# Patient Record
Sex: Male | Born: 1960
Health system: Southern US, Community
[De-identification: ages and names within clinical notes are randomized; demographics above are authoritative.]

## PROBLEM LIST (undated history)

## (undated) DIAGNOSIS — K589 Irritable bowel syndrome without diarrhea: Secondary | ICD-10-CM

---

## 2016-05-01 ENCOUNTER — Ambulatory Visit (INDEPENDENT_AMBULATORY_CARE_PROVIDER_SITE_OTHER): Payer: BLUE CROSS/BLUE SHIELD | Admitting: Family Medicine

## 2016-05-01 ENCOUNTER — Encounter: Payer: Self-pay | Admitting: Family Medicine

## 2016-05-01 ENCOUNTER — Encounter (INDEPENDENT_AMBULATORY_CARE_PROVIDER_SITE_OTHER): Payer: Self-pay

## 2016-05-01 VITALS — BP 147/95 | HR 94 | Temp 98.5°F | Ht 70.5 in | Wt 212.4 lb

## 2016-05-01 DIAGNOSIS — B353 Tinea pedis: Secondary | ICD-10-CM | POA: Diagnosis not present

## 2016-05-01 DIAGNOSIS — B379 Candidiasis, unspecified: Secondary | ICD-10-CM | POA: Diagnosis not present

## 2016-05-01 DIAGNOSIS — R42 Dizziness and giddiness: Secondary | ICD-10-CM

## 2016-05-01 MED ORDER — TERBINAFINE HCL 1 % EX CREA: 1.0000 "application " | TOPICAL_CREAM | Freq: Two times a day (BID) | CUTANEOUS | 0 refills | Status: DC

## 2016-05-01 MED ORDER — NYSTATIN 100000 UNIT/GM EX CREA: 1.0000 "application " | TOPICAL_CREAM | Freq: Two times a day (BID) | CUTANEOUS | 2 refills | Status: DC

## 2016-05-01 NOTE — Progress Notes (Signed)
BP (!) 147/95 (BP Location: Right Arm, Patient Position: Sitting, Cuff Size: Normal)   Pulse 94   Temp 98.5 F (36.9 C)   Ht 5' 10.5" (1.791 m)   Wt 212 lb 6.4 oz (96.3 kg)   SpO2 95%   BMI 30.05 kg/m    Subjective:    Patient ID: Carlos Holmes, male    DOB: 1961/01/21, 55 y.o.   MRN: [id number]  HPI: Carlos Poplmer Heinecke is a 55 y.o. male  Chief Complaint  Patient presents with  . Establish Care  . Other    Food problem/eating problem. x's 40 years. Has collapsed before. Has blacked out before. Eats regularly. But will feel terrible. Headaches, fatigue. Did blood tests 20 years ago for diabetes; blood work came back normal.    Patient presents today for Est Care visit. Has not been seen for 10-15 years now. Unaware of any medical problems and is not on any presciption medications. States for over 20 years, he has been struggling with headaches, dizziness, syncope that appears to be closely tied to diet. Usually resolves with eating, but will feel fatigued for 1-3 days afterwards. Has not blacked out in a long time (15 years) because he has been very careful about what he eats. Becomes worse when he's out in the heat.   Has been working out more but not able to lose weight. Has gained about 50 lb in the past 3 years despite being very careful about diet and exercise. Has been doing 5ks with his wife with no issues.   Has rash in b/l groin area that seems to only appear with consumption of sugar. Alternates between hydrocortisone and jock itch cream over the counter with minimal relief. Also has an intermittent rash on right bottom of foot that peels quite a bit and can be itchy at times. Has not tried any creams.                   Maternal fhx of Diabetes - likely type 2, heart disease (MIs in early 4940s) Paternal fhx of HTN   Relevant past medical, surgical, family and social history reviewed and updated as indicated. Interim medical history since our last visit reviewed. Allergies and  medications reviewed and updated.  Review of Systems  Constitutional: Positive for fatigue.  Respiratory: Negative.   Cardiovascular: Negative.   Gastrointestinal: Negative.   Musculoskeletal: Negative.   Neurological: Positive for dizziness, syncope and headaches.  Psychiatric/Behavioral: Negative.     Per HPI unless specifically indicated above     Objective:    BP (!) 147/95 (BP Location: Right Arm, Patient Position: Sitting, Cuff Size: Normal)   Pulse 94   Temp 98.5 F (36.9 C)   Ht 5' 10.5" (1.791 m)   Wt 212 lb 6.4 oz (96.3 kg)   SpO2 95%   BMI 30.05 kg/m   Wt Readings from Last 3 Encounters:  05/01/16 212 lb 6.4 oz (96.3 kg)    Physical Exam  Constitutional: He is oriented to person, place, and time. He appears well-developed and well-nourished. No distress.  HENT:  Head: Atraumatic.  Eyes: Conjunctivae are normal. No scleral icterus.  Neck: Normal range of motion. Neck supple.  Cardiovascular: Normal rate, regular rhythm and normal heart sounds.   Pulmonary/Chest: Effort normal and breath sounds normal. No respiratory distress.  Abdominal: Soft. Bowel sounds are normal.  Musculoskeletal: Normal range of motion.  Lymphadenopathy:    He has no cervical adenopathy.  Neurological: He is alert and  oriented to person, place, and time.  Skin: Skin is warm and dry.  Psychiatric: He has a normal mood and affect. His behavior is normal.  Nursing note and vitals reviewed.   No results found for this or any previous visit.    Assessment & Plan:   Problem List Items Addressed This Visit    None    Visit Diagnoses    Dizziness    -  Primary   Basic labs and EKG today to begin. Will proceed according to results.    Relevant Orders   Comprehensive metabolic panel   CBC with Differential/Platelet   HgB A1c   TSH   EKG 12-Lead (Completed)   Candida infection       B/l groin, nystatin cream sent. Recommended he keep area clean and dry. Can use powders. Only cotton  underwear.    Relevant Medications   nystatin cream (MYCOSTATIN)   terbinafine (LAMISIL AT) 1 % cream   Tinea pedis of right foot       Lamisil cream sent. Patient would like to try OTC creams first for fungal infections but will try this if no relief. Change shoes often, use powders to keep dry   Relevant Medications   nystatin cream (MYCOSTATIN)   terbinafine (LAMISIL AT) 1 % cream    EKG normal today, exam benign. Likely an endocrine Suggested that patient schedule full physical as he is overdue and that could serve as his follow up/next steps, but patient wishes to hold off on anything comprehensive until this is figured out as the issue is becoming more severe and frequent and worrying him a good deal. Discussed that he should go to the ER for worsening or changing symptoms. Will schedule follow up when lab results are given.   Follow up plan: Return if symptoms worsen or fail to improve.

## 2016-05-01 NOTE — Patient Instructions (Signed)
Follow up as needed

## 2016-05-02 ENCOUNTER — Ambulatory Visit: Payer: Self-pay | Admitting: Family Medicine

## 2016-05-02 LAB — HEMOGLOBIN A1C
ESTIMATED AVERAGE GLUCOSE: 117 mg/dL
Hgb A1c MFr Bld: 5.7 % — ABNORMAL HIGH (ref 4.8–5.6)

## 2016-05-02 LAB — CBC WITH DIFFERENTIAL/PLATELET
Basophils Absolute: 0.1 10*3/uL (ref 0.0–0.2)
Basos: 1 %
EOS (ABSOLUTE): 0.2 10*3/uL (ref 0.0–0.4)
EOS: 2 %
HEMATOCRIT: 41.3 % (ref 37.5–51.0)
HEMOGLOBIN: 13.4 g/dL (ref 12.6–17.7)
Immature Grans (Abs): 0 10*3/uL (ref 0.0–0.1)
Immature Granulocytes: 0 %
LYMPHS ABS: 2 10*3/uL (ref 0.7–3.1)
Lymphs: 23 %
MCH: 29.2 pg (ref 26.6–33.0)
MCHC: 32.4 g/dL (ref 31.5–35.7)
MCV: 90 fL (ref 79–97)
MONOCYTES: 8 %
Monocytes Absolute: 0.7 10*3/uL (ref 0.1–0.9)
NEUTROS ABS: 5.5 10*3/uL (ref 1.4–7.0)
Neutrophils: 66 %
Platelets: 239 10*3/uL (ref 150–379)
RBC: 4.59 x10E6/uL (ref 4.14–5.80)
RDW: 14.1 % (ref 12.3–15.4)
WBC: 8.3 10*3/uL (ref 3.4–10.8)

## 2016-05-02 LAB — COMPREHENSIVE METABOLIC PANEL
ALBUMIN: 4.4 g/dL (ref 3.5–5.5)
ALT: 26 IU/L (ref 0–44)
AST: 18 IU/L (ref 0–40)
Albumin/Globulin Ratio: 1.6 (ref 1.2–2.2)
Alkaline Phosphatase: 75 IU/L (ref 39–117)
BILIRUBIN TOTAL: 0.3 mg/dL (ref 0.0–1.2)
BUN / CREAT RATIO: 12 (ref 9–20)
BUN: 15 mg/dL (ref 6–24)
CO2: 26 mmol/L (ref 18–29)
CREATININE: 1.23 mg/dL (ref 0.76–1.27)
Calcium: 9.5 mg/dL (ref 8.7–10.2)
Chloride: 101 mmol/L (ref 96–106)
GFR calc non Af Amer: 65 mL/min/{1.73_m2} (ref >59)
GFR, EST AFRICAN AMERICAN: 75 mL/min/{1.73_m2} (ref >59)
GLUCOSE: 114 mg/dL — AB (ref 65–99)
Globulin, Total: 2.7 g/dL (ref 1.5–4.5)
Potassium: 4.3 mmol/L (ref 3.5–5.2)
Sodium: 143 mmol/L (ref 134–144)
TOTAL PROTEIN: 7.1 g/dL (ref 6.0–8.5)

## 2016-05-02 LAB — TSH: TSH: 1.65 u[IU]/mL (ref 0.450–4.500)

## 2016-05-06 ENCOUNTER — Telehealth: Payer: Self-pay | Admitting: Family Medicine

## 2016-05-06 DIAGNOSIS — R55 Syncope and collapse: Secondary | ICD-10-CM

## 2016-05-06 NOTE — Telephone Encounter (Signed)
Please call patient and let him know that his labs and EKG came back normal, other than his A1C just barely being in the pre-diabetes category at 5.7. Nothing here explains his passing out spells related to eating, so I would like to refer him to endocrinology at this point for further investigation as I cannot explain a cause. Let me know if he is agreeable and I will place the referral.

## 2016-05-06 NOTE — Telephone Encounter (Signed)
Left message to call.

## 2016-05-06 NOTE — Telephone Encounter (Signed)
Referral to endocrinology placed

## 2016-05-06 NOTE — Telephone Encounter (Signed)
Patient notified. He is willing to go to endocrinology. Please enter referral.  He states any day is fine, but prefers early am or late pm.

## 2016-05-21 DIAGNOSIS — R42 Dizziness and giddiness: Secondary | ICD-10-CM | POA: Diagnosis not present

## 2016-05-22 DIAGNOSIS — R42 Dizziness and giddiness: Secondary | ICD-10-CM | POA: Diagnosis not present

## 2016-06-26 DIAGNOSIS — R7303 Prediabetes: Secondary | ICD-10-CM | POA: Diagnosis not present

## 2016-09-23 DIAGNOSIS — R7303 Prediabetes: Secondary | ICD-10-CM | POA: Diagnosis not present

## 2016-09-30 DIAGNOSIS — R7303 Prediabetes: Secondary | ICD-10-CM | POA: Diagnosis not present

## 2016-09-30 DIAGNOSIS — E663 Overweight: Secondary | ICD-10-CM | POA: Diagnosis not present

## 2016-12-09 ENCOUNTER — Ambulatory Visit (INDEPENDENT_AMBULATORY_CARE_PROVIDER_SITE_OTHER): Payer: BLUE CROSS/BLUE SHIELD | Admitting: Family Medicine

## 2016-12-09 ENCOUNTER — Encounter: Payer: Self-pay | Admitting: Family Medicine

## 2016-12-09 ENCOUNTER — Ambulatory Visit
Admission: RE | Admit: 2016-12-09 | Discharge: 2016-12-09 | Disposition: A | Discharge status: Home / Self Care | Payer: BLUE CROSS/BLUE SHIELD | Source: Ambulatory Visit | Attending: Family Medicine | Admitting: Family Medicine

## 2016-12-09 VITALS — BP 133/89 | HR 96 | Temp 97.9°F | Resp 17 | Ht 70.5 in | Wt 263.0 lb

## 2016-12-09 DIAGNOSIS — H9313 Tinnitus, bilateral: Secondary | ICD-10-CM | POA: Diagnosis not present

## 2016-12-09 DIAGNOSIS — R4020 Unspecified coma: Secondary | ICD-10-CM

## 2016-12-09 DIAGNOSIS — H73893 Other specified disorders of tympanic membrane, bilateral: Secondary | ICD-10-CM | POA: Insufficient documentation

## 2016-12-09 DIAGNOSIS — R55 Syncope and collapse: Secondary | ICD-10-CM | POA: Diagnosis not present

## 2016-12-09 DIAGNOSIS — H9319 Tinnitus, unspecified ear: Secondary | ICD-10-CM | POA: Insufficient documentation

## 2016-12-09 DIAGNOSIS — W19XXXA Unspecified fall, initial encounter: Secondary | ICD-10-CM | POA: Diagnosis not present

## 2016-12-09 DIAGNOSIS — R51 Headache: Secondary | ICD-10-CM | POA: Diagnosis not present

## 2016-12-09 NOTE — Progress Notes (Signed)
BP 133/89 (BP Location: Left Arm, Patient Position: Sitting, Cuff Size: Large)   Pulse 96   Temp 97.9 F (36.6 C) (Oral)   Resp 17   Ht 5' 10.5" (1.791 m)   Wt 263 lb (119.3 kg)   SpO2 96%   BMI 37.20 kg/m    Subjective:    Patient ID: Carlos Holmes, male    DOB: 08/02/1961, 56 y.o.   MRN: [id number]  HPI: Carlos Holmes is a 56 y.o. male  Chief Complaint  Patient presents with  . Tinnitus    Onset many years/ constant now   About 6 weeks ago patient got up too quickly, blacked out and fell. He hit his head and gave himself a really big goose egg. He did not go to get checked out. He has not seen a doctor since then. He has had a severe headache. He is not sure if he passed out or not as he was alone when it happened. Unclear if he was out at the time. He does not think that the tinnitus is related to his fall, but it did intensify around the same time.   TINNITUS Duration: 10+ years- significantly worse over the last month Description of tinnitus: Changes in amplitude, "noise" low amplitude "ehhhhh" can get to be almost screaming Pulsatile: no Tinnitus duration: continuous Episode frequency: continous- changes pitch and amplitude Severity: severe Aggravating factors: phentermine Alleviating factors: unknown Head injury: yes Chronic exposure to loud noises: yes Exposure to ototoxic medications: no Vertigo:no Hearing loss: yes Aural fullness: yes Headache:yes  TMJ syndrome symptoms: no Unsteady gait: no Postural instability: no Diplopia, dysarthria, dysphagia or weakness: no, has been having some dysarthria for several years, but has been getting worse Anxietydepression: no  Relevant past medical, surgical, family and social history reviewed and updated as indicated. Interim medical history since our last visit reviewed. Allergies and medications reviewed and updated.  Review of Systems  Constitutional: Negative.   HENT: Positive for ear discharge, hearing loss and  tinnitus. Negative for congestion, dental problem, drooling, ear pain, facial swelling, mouth sores, nosebleeds, postnasal drip, rhinorrhea, sinus pain, sinus pressure, sneezing, sore throat, trouble swallowing and voice change.   Respiratory: Negative.   Cardiovascular: Negative.   Neurological: Positive for dizziness, syncope, light-headedness and headaches. Negative for tremors, seizures, facial asymmetry, speech difficulty, weakness and numbness.  Psychiatric/Behavioral: Negative.     Per HPI unless specifically indicated above     Objective:    BP 133/89 (BP Location: Left Arm, Patient Position: Sitting, Cuff Size: Large)   Pulse 96   Temp 97.9 F (36.6 C) (Oral)   Resp 17   Ht 5' 10.5" (1.791 m)   Wt 263 lb (119.3 kg)   SpO2 96%   BMI 37.20 kg/m   Wt Readings from Last 3 Encounters:  12/09/16 263 lb (119.3 kg)  05/01/16 212 lb 6.4 oz (96.3 kg)     Hearing Screening             Right ear:   40 40 25      Left ear:   25 40 25        Physical Exam  Constitutional: He is oriented to person, place, and time. He appears well-developed and well-nourished. No distress.  HENT:  Head: Normocephalic and atraumatic.  Right Ear: Hearing and external ear normal.  Left Ear: Hearing and external ear normal.  Nose: Nose normal.  Mouth/Throat: Oropharynx is clear and moist. No oropharyngeal exudate.  Eyes: Conjunctivae, EOM  and lids are normal. Pupils are equal, round, and reactive to light. Right eye exhibits no discharge. Left eye exhibits no discharge. No scleral icterus.  Neck: Normal range of motion. Neck supple. No JVD present. No tracheal deviation present. No thyromegaly present.  Cardiovascular: Normal rate, regular rhythm, normal heart sounds and intact distal pulses.  Exam reveals no gallop and no friction rub.   No murmur heard. Pulmonary/Chest: Effort normal and breath sounds normal. No stridor. No respiratory distress.  He has no wheezes. He has no rales. He exhibits no tenderness.  Musculoskeletal: Normal range of motion.  Lymphadenopathy:    He has no cervical adenopathy.  Neurological: He is alert and oriented to person, place, and time. He has normal reflexes. He displays normal reflexes. No cranial nerve deficit. He exhibits normal muscle tone. Coordination normal.  Negative rhomberg Negative pronator driff  Skin: Skin is warm, dry and intact. No rash noted. He is not diaphoretic. No erythema. No pallor.  Psychiatric: He has a normal mood and affect. His speech is normal and behavior is normal. Judgment and thought content normal. Cognition and memory are normal.  Nursing note and vitals reviewed.   Results for orders placed or performed in visit on 05/01/16  Comprehensive metabolic panel  Result Value Ref Range   Glucose 114 (H) 65 - 99 mg/dL   BUN 15 6 - 24 mg/dL   Creatinine, Ser 1.61 0.76 - 1.27 mg/dL   GFR calc non Af Amer 65 >59 mL/min/1.73   GFR calc Af Amer 75 >59 mL/min/1.73   BUN/Creatinine Ratio 12 9 - 20   Sodium 143 134 - 144 mmol/L   Potassium 4.3 3.5 - 5.2 mmol/L   Chloride 101 96 - 106 mmol/L   CO2 26 18 - 29 mmol/L   Calcium 9.5 8.7 - 10.2 mg/dL   Total Protein 7.1 6.0 - 8.5 g/dL   Albumin 4.4 3.5 - 5.5 g/dL   Globulin, Total 2.7 1.5 - 4.5 g/dL   Albumin/Globulin Ratio 1.6 1.2 - 2.2   Bilirubin Total 0.3 0.0 - 1.2 mg/dL   Alkaline Phosphatase 75 39 - 117 IU/L   AST 18 0 - 40 IU/L   ALT 26 0 - 44 IU/L  CBC with Differential/Platelet  Result Value Ref Range   WBC 8.3 3.4 - 10.8 x10E3/uL   RBC 4.59 4.14 - 5.80 x10E6/uL   Hemoglobin 13.4 12.6 - 17.7 g/dL   Hematocrit 09.6 04.5 - 51.0 %   MCV 90 79 - 97 fL   MCH 29.2 26.6 - 33.0 pg   MCHC 32.4 31.5 - 35.7 g/dL   RDW 40.9 81.1 - 91.4 %   Platelets 239 150 - 379 x10E3/uL   Neutrophils 66 %   Lymphs 23 %   Monocytes 8 %   Eos 2 %   Basos 1 %   Neutrophils Absolute 5.5 1.4 - 7.0 x10E3/uL   Lymphocytes Absolute 2.0 0.7  - 3.1 x10E3/uL   Monocytes Absolute 0.7 0.1 - 0.9 x10E3/uL   EOS (ABSOLUTE) 0.2 0.0 - 0.4 x10E3/uL   Basophils Absolute 0.1 0.0 - 0.2 x10E3/uL   Immature Granulocytes 0 %   Immature Grans (Abs) 0.0 0.0 - 0.1 x10E3/uL  HgB A1c  Result Value Ref Range   Hgb A1c MFr Bld 5.7 (H) 4.8 - 5.6 %   Est. average glucose Bld gHb Est-mCnc 117 mg/dL  TSH  Result Value Ref Range   TSH 1.650 0.450 - 4.500 uIU/mL  Assessment & Plan:   Problem List Items Addressed This Visit    None    Visit Diagnoses    Tinnitus of both ears    -  Primary   Will obtain CT due to fall. Will get him into ENT, checking BMP follow up after CT.    Relevant Orders   Basic metabolic panel   Ambulatory referral to ENT   LOC (loss of consciousness) (HCC)       Likely orthostatic. Move more slowly. Information given. Will obtain CT of his head. Await results.    Relevant Orders   Basic metabolic panel   CT HEAD WO CONTRAST   Syncope, unspecified syncope type       Likely orthostatic. Move more slowly. Information given. Will obtain CT of his head. Await results.    Relevant Orders   Basic metabolic panel   CT HEAD WO CONTRAST       Follow up plan: Return Pending results.

## 2016-12-09 NOTE — Patient Instructions (Addendum)
Tinnitus  Tinnitus refers to hearing a sound when there is no actual source for that sound. This is often described as ringing in the ears. However, people with this condition may hear a variety of noises. A person may hear the sound in one ear or in both ears.  The sounds of tinnitus can be soft, loud, or somewhere in between. Tinnitus can last for a few seconds or can be constant for days. It may go away without treatment and come back at various times. When tinnitus is constant or happens often, it can lead to other problems, such as trouble sleeping and trouble concentrating.  Almost everyone experiences tinnitus at some point. Tinnitus that is long-lasting (chronic) or comes back often is a problem that may require medical attention.  What are the causes?  The cause of tinnitus is often not known. In some cases, it can result from other problems or conditions, including:  · Exposure to loud noises from machinery, music, or other sources.  · Hearing loss.  · Ear or sinus infections.  · Earwax buildup.  · A foreign object in the ear.  · Use of certain medicines.  · Use of alcohol and caffeine.  · High blood pressure.  · Heart diseases.  · Anemia.  · Allergies.  · Meniere disease.  · Thyroid problems.  · Tumors.  · An enlarged part of a weakened blood vessel (aneurysm).    What are the signs or symptoms?  The main symptom of tinnitus is hearing a sound when there is no source for that sound. It may sound like:  · Buzzing.  · Roaring.  · Ringing.  · Blowing air, similar to the sound heard when you listen to a seashell.  · Hissing.  · Whistling.  · Sizzling.  · Humming.  · Running water.  · A sustained musical note.    How is this diagnosed?  Tinnitus is diagnosed based on your symptoms. Your health care provider will do a physical exam. A comprehensive hearing exam (audiologic exam) will be done if your tinnitus:  · Affects only one ear (unilateral).  · Causes hearing difficulties.  · Lasts 6 months or  longer.    You may also need to see a health care provider who specializes in hearing disorders (audiologist). You may be asked to complete a questionnaire to determine the severity of your tinnitus. Tests may be done to help determine the cause and to rule out other conditions. These can include:  · Imaging studies of your head and brain, such as:  ? A CT scan.  ? An MRI.  · An imaging study of your blood vessels (angiogram).    How is this treated?  Treating an underlying medical condition can sometimes make tinnitus go away. If your tinnitus continues, other treatments may include:  · Medicines, such as certain antidepressants or sleeping aids.  · Sound generators to mask the tinnitus. These include:  ? Tabletop sound machines that play relaxing sounds to help you fall asleep.  ? Wearable devices that fit in your ear and play sounds or music.  ? A small device that uses headphones to deliver a signal embedded in music (acoustic neural stimulation). In time, this may change the pathways of your brain and make you less sensitive to tinnitus. This device is used for very severe cases when no other treatment is working.  · Therapy and counseling to help you manage the stress of living with tinnitus.  · Using   hearing aids or cochlear implants, if your tinnitus is related to hearing loss.    Follow these instructions at home:  · When possible, avoid being in loud places and being exposed to loud sounds.  · Wear hearing protection, such as earplugs, when you are exposed to loud noises.  · Do not take stimulants, such as nicotine, alcohol, or caffeine.  · Practice techniques for reducing stress, such as meditation, yoga, or deep breathing.  · Use a white noise machine, a humidifier, or other devices to mask the sound of tinnitus.  · Sleep with your head slightly raised. This may reduce the impact of tinnitus.  · Try to get plenty of rest each night.  Contact a health care provider if:  · You have tinnitus in just one  ear.  · Your tinnitus continues for 3 weeks or longer without stopping.  · Home care measures are not helping.  · You have tinnitus after a head injury.  · You have tinnitus along with any of the following:  ? Dizziness.  ? Loss of balance.  ? Nausea and vomiting.  This information is not intended to replace advice given to you by your health care provider. Make sure you discuss any questions you have with your health care provider.  Document Released: 08/18/2005 Document Revised: 04/20/2016 Document Reviewed: 01/18/2014  Elsevier Interactive Patient Education © 2017 [employer]

## 2016-12-10 ENCOUNTER — Encounter: Payer: Self-pay | Admitting: Family Medicine

## 2016-12-10 LAB — BASIC METABOLIC PANEL
BUN / CREAT RATIO: 13 (ref 9–20)
BUN: 15 mg/dL (ref 6–24)
CALCIUM: 9.5 mg/dL (ref 8.7–10.2)
CO2: 26 mmol/L (ref 18–29)
CREATININE: 1.12 mg/dL (ref 0.76–1.27)
Chloride: 98 mmol/L (ref 96–106)
GFR calc non Af Amer: 73 mL/min/{1.73_m2} (ref >59)
GFR, EST AFRICAN AMERICAN: 84 mL/min/{1.73_m2} (ref >59)
GLUCOSE: 87 mg/dL (ref 65–99)
Potassium: 4.4 mmol/L (ref 3.5–5.2)
Sodium: 139 mmol/L (ref 134–144)

## 2016-12-25 DIAGNOSIS — E663 Overweight: Secondary | ICD-10-CM | POA: Diagnosis not present

## 2016-12-25 DIAGNOSIS — R7303 Prediabetes: Secondary | ICD-10-CM | POA: Diagnosis not present

## 2017-01-02 DIAGNOSIS — H903 Sensorineural hearing loss, bilateral: Secondary | ICD-10-CM | POA: Diagnosis not present

## 2017-01-02 DIAGNOSIS — H9319 Tinnitus, unspecified ear: Secondary | ICD-10-CM | POA: Diagnosis not present

## 2017-05-29 DIAGNOSIS — H6063 Unspecified chronic otitis externa, bilateral: Secondary | ICD-10-CM | POA: Diagnosis not present

## 2018-08-02 ENCOUNTER — Encounter: Payer: Self-pay | Admitting: Family Medicine

## 2018-08-02 ENCOUNTER — Telehealth: Payer: Self-pay | Admitting: Family Medicine

## 2018-08-02 ENCOUNTER — Ambulatory Visit: Payer: BLUE CROSS/BLUE SHIELD | Admitting: Family Medicine

## 2018-08-02 VITALS — BP 133/94 | HR 78 | Temp 97.8°F | Ht 70.5 in | Wt 278.4 lb

## 2018-08-02 DIAGNOSIS — H60501 Unspecified acute noninfective otitis externa, right ear: Secondary | ICD-10-CM | POA: Diagnosis not present

## 2018-08-02 DIAGNOSIS — G4700 Insomnia, unspecified: Secondary | ICD-10-CM | POA: Diagnosis not present

## 2018-08-02 MED ORDER — OFLOXACIN 0.3 % OT SOLN: 5.0000 [drp] | Freq: Every day | OTIC | 0 refills | Status: DC

## 2018-08-02 MED ORDER — SUVOREXANT 10 MG PO TABS: 10.0000 mg | ORAL_TABLET | Freq: Every evening | ORAL | 0 refills | Status: DC | PRN

## 2018-08-02 MED ORDER — CIPROFLOXACIN-HYDROCORTISONE 0.2-1 % OT SUSP: 3.0000 [drp] | Freq: Two times a day (BID) | OTIC | 0 refills | Status: DC

## 2018-08-02 NOTE — Telephone Encounter (Signed)
Copied from CRM [phone]. Topic: Quick Communication - Rx Refill/Question >> Aug 02, 2018  1:00 PM Burchel, Abbi R wrote: Medication: ciprofloxacin-hydrocortisone (CIPRO HC) OTIC suspension   Pt's wife states rx is $108 after insurance coverage.  Is there another option or a generic?  Preferred Pharmacy: [id number] - [place], [place] - [address] [place] [id number] Phone: [phone] Fax: [phone]

## 2018-08-02 NOTE — Progress Notes (Signed)
BP (!) 133/94 (BP Location: Right Arm, Patient Position: Sitting, Cuff Size: Large)   Pulse 78   Temp 97.8 F (36.6 C)   Ht 5' 10.5" (1.791 m)   Wt 278 lb 6 oz (126.3 kg)   SpO2 95%   BMI 39.38 kg/m    Subjective:    Patient ID: Carlos Holmes, male    DOB: [birth year], 57 y.o.   MRN: [id number]  HPI: Carlos Holmes is a 57 y.o. male  Chief Complaint  Patient presents with  . Ear Pain    right X 1 month  . Insomnia   Right ear discharge and shooting pains down jaw x 1 month. Taking aspirin and using hot compresses with some temporary relief. Saw ENT last year and got hearing aids and some steroid cream for eczema inside of his ears which he uses prn. Denies fevers, chills, redness.   15 year hx of issues staying asleep. No issues falling asleep. OTC sleep medications help short term but do not keep him asleep more than a few hours.   History reviewed. No pertinent past medical history.  Social History   Socioeconomic History  . Marital status: Married    Spouse name: Not on file  . Number of children: Not on file  . Years of education: Not on file  . Highest education level: Not on file  Occupational History  . Not on file  Social Needs  . Financial resource strain: Not on file  . Food insecurity:    Worry: Not on file    Inability: Not on file  . Transportation needs:    Medical: Not on file    Non-medical: Not on file  Tobacco Use  . Smoking status: Former Smoker    Types: Cigarettes    Last attempt to quit: 1984    Years since quitting: 35.9  . Smokeless tobacco: Never Used  Substance and Sexual Activity  . Alcohol use: Yes    Alcohol/week: 1.0 standard drinks    Types: 1 Glasses of wine per week  . Drug use: No  . Sexual activity: Not on file  Lifestyle  . Physical activity:    Days per week: Not on file    Minutes per session: Not on file  . Stress: Not on file  Relationships  . Social connections:    Talks on phone: Not on file    Gets together:  Not on file    Attends religious service: Not on file    Active member of club or organization: Not on file    Attends meetings of clubs or organizations: Not on file    Relationship status: Not on file  . Intimate partner violence:    Fear of current or ex partner: Not on file    Emotionally abused: Not on file    Physically abused: Not on file    Forced sexual activity: Not on file  Other Topics Concern  . Not on file  Social History Narrative  . Not on file    Relevant past medical, surgical, family and social history reviewed and updated as indicated. Interim medical history since our last visit reviewed. Allergies and medications reviewed and updated.  Review of Systems  Per HPI unless specifically indicated above     Objective:    BP (!) 133/94 (BP Location: Right Arm, Patient Position: Sitting, Cuff Size: Large)   Pulse 78   Temp 97.8 F (36.6 C)   Ht 5' 10.5" (1.791 m)  Wt 278 lb 6 oz (126.3 kg)   SpO2 95%   BMI 39.38 kg/m   Wt Readings from Last 3 Encounters:  08/02/18 278 lb 6 oz (126.3 kg)  12/09/16 263 lb (119.3 kg)  05/01/16 212 lb 6.4 oz (96.3 kg)    Physical Exam  Constitutional: He is oriented to person, place, and time. He appears well-developed and well-nourished. No distress.  HENT:  Head: Atraumatic.  Right EAC significantly swollen and moderately erythematous. TTP with otoscope tip Left EAC and b/l TMs benign  Eyes: Pupils are equal, round, and reactive to light. Conjunctivae and EOM are normal.  Neck: Normal range of motion. Neck supple.  Cardiovascular: Normal rate, regular rhythm and normal heart sounds.  Pulmonary/Chest: Effort normal and breath sounds normal.  Musculoskeletal: Normal range of motion.  Lymphadenopathy:    He has no cervical adenopathy.  Neurological: He is alert and oriented to person, place, and time. No cranial nerve deficit.  Skin: Skin is warm and dry.  Psychiatric: He has a normal mood and affect. His behavior is  normal.  Nursing note and vitals reviewed.   Results for orders placed or performed in visit on 12/09/16  Basic metabolic panel  Result Value Ref Range   Glucose 87 65 - 99 mg/dL   BUN 15 6 - 24 mg/dL   Creatinine, Ser 1.611.12 0.76 - 1.27 mg/dL   GFR calc non Af Amer 73 >59 mL/min/1.73   GFR calc Af Amer 84 >59 mL/min/1.73   BUN/Creatinine Ratio 13 9 - 20   Sodium 139 134 - 144 mmol/L   Potassium 4.4 3.5 - 5.2 mmol/L   Chloride 98 96 - 106 mmol/L   CO2 26 18 - 29 mmol/L   Calcium 9.5 8.7 - 10.2 mg/dL      Assessment & Plan:   Problem List Items Addressed This Visit      Other   Insomnia    Will trial belsomra to help him stay asleep. Sleep hygiene reviewed       Other Visit Diagnoses    Acute otitis externa of right ear, unspecified type    -  Primary   Antibiotic drops given, continue eczema treatment as well as warm compresses       Follow up plan: Return in about 4 weeks (around 08/30/2018) for CPE, insomnia follow up.

## 2018-08-02 NOTE — Telephone Encounter (Signed)
Patient notified

## 2018-08-02 NOTE — Telephone Encounter (Signed)
Sent in a different drop, if that's too expensive they can have the pharmacy let us know what is affordable and I'm happy to send that in

## 2018-08-03 ENCOUNTER — Telehealth: Payer: Self-pay

## 2018-08-03 DIAGNOSIS — R7303 Prediabetes: Secondary | ICD-10-CM | POA: Insufficient documentation

## 2018-08-03 NOTE — Telephone Encounter (Signed)
Per covermymeds, "[employer] does not manage PA for this patient. Please contact the number on the back of the members card for further assistance"

## 2018-08-03 NOTE — Telephone Encounter (Signed)
Prior authorization for Belsomra was initiated via [email or web address]. Key: [id number]

## 2018-08-03 NOTE — Telephone Encounter (Signed)
[facility] and got patient's pharmacy benefit information [employer]: [id number] ID: [id number]. PA was redone w/ this information via [email or web address]

## 2018-08-05 DIAGNOSIS — G4700 Insomnia, unspecified: Secondary | ICD-10-CM | POA: Insufficient documentation

## 2018-08-05 NOTE — Assessment & Plan Note (Signed)
Will trial belsomra to help him stay asleep. Sleep hygiene reviewed

## 2018-08-11 ENCOUNTER — Telehealth: Payer: Self-pay | Admitting: Family Medicine

## 2018-08-11 NOTE — Telephone Encounter (Signed)
Attempted to reach wife, Darl PikesSusan. Unsure who need the information. PA was done on 08/03/18. Did go recheck do not see them requesting any further information for.

## 2018-08-11 NOTE — Telephone Encounter (Signed)
Medication was given for 15+ years of insomnia after failing OTC sleep aids - benadryl, zzquil, unisom, melatonin.   Copied from CRM [phone]. Topic: General - Other >> Aug 10, 2018  9:59 AM Marylen PontoMcneil, Ja-Kwan wrote: Reason for CRM: Pt wife Darl PikesSusan stated that the pharmacy informed her that their insurance is requesting the reason pt is being prescribed the Suvorexant (BELSOMRA) 10 MG TABS. Pt wife Darl PikesSusan also stated the insurance company would like to know if any other medications have been tried. Requests call back. Cb# [phone]

## 2018-08-11 NOTE — Telephone Encounter (Signed)
Spoke with wife. She stated she that pharmacy just told her about this she did not know who to contact. Will see if we have received a fax.

## 2018-08-11 NOTE — Telephone Encounter (Signed)
° °  Pt wife returned Jada's call

## 2018-08-13 NOTE — Telephone Encounter (Signed)
New PA submitted vis cover my meds Key: [id number] - PA Case ID: [id number] Will wait for approval or denial.

## 2018-08-19 ENCOUNTER — Telehealth: Payer: Self-pay | Admitting: Family Medicine

## 2018-08-19 MED ORDER — TRAZODONE HCL 50 MG PO TABS: 25.0000 mg | ORAL_TABLET | Freq: Every evening | ORAL | 0 refills | Status: DC | PRN

## 2018-08-19 NOTE — Telephone Encounter (Signed)
Copied from CRM [id number]. Topic: Quick Communication - See Telephone Encounter >> Aug 19, 2018 11:30 AM Windy KalataMichael, Reakwon Barren L, NT wrote: CRM for notification. See Telephone encounter for: 08/19/18.  [facility] is [facility] and states the prior authorization for Suvorexant [facility](BELSOMRA) 10 MG TABS was denied due to no documentation of the patient trying atleast 2 of following medication. Zolpidem, Zolpidem ER, Zaleplon or Eszopiclone.   CB# [phone]   CB# to Peer to Peer line [phone]

## 2018-08-19 NOTE — Telephone Encounter (Signed)
Patient notified. Patient has appointment on 09/06/18.

## 2018-08-19 NOTE — Telephone Encounter (Signed)
We could send over some trazodone for him instead if he's ok with that

## 2018-08-19 NOTE — Telephone Encounter (Signed)
Wife in the office today, concerned about his belsomra. Would like a check in on PA.

## 2018-08-19 NOTE — Telephone Encounter (Signed)
Got it ordered for him, let's see him back in about a month to check how things are going

## 2018-08-19 NOTE — Telephone Encounter (Signed)
Have not received fax yet. [employer]eds does not saw what is covered.

## 2018-08-19 NOTE — Telephone Encounter (Signed)
Message relayed to patient. Verbalized understanding and denied questions. Pt agreeable to try Trazadone.

## 2018-08-19 NOTE — Telephone Encounter (Signed)
Per [email or web address] request was denied.

## 2018-08-20 NOTE — Telephone Encounter (Signed)
Patient was called yesterday, started on trazodone due to denial of belsomra

## 2018-09-06 ENCOUNTER — Ambulatory Visit (INDEPENDENT_AMBULATORY_CARE_PROVIDER_SITE_OTHER): Payer: BLUE CROSS/BLUE SHIELD | Admitting: Family Medicine

## 2018-09-06 ENCOUNTER — Encounter: Payer: Self-pay | Admitting: Family Medicine

## 2018-09-06 VITALS — BP 158/93 | HR 81 | Temp 97.8°F | Ht 71.1 in | Wt 282.2 lb

## 2018-09-06 DIAGNOSIS — R7303 Prediabetes: Secondary | ICD-10-CM

## 2018-09-06 DIAGNOSIS — R030 Elevated blood-pressure reading, without diagnosis of hypertension: Secondary | ICD-10-CM | POA: Diagnosis not present

## 2018-09-06 DIAGNOSIS — Z6839 Body mass index (BMI) 39.0-39.9, adult: Secondary | ICD-10-CM

## 2018-09-06 DIAGNOSIS — F5101 Primary insomnia: Secondary | ICD-10-CM

## 2018-09-06 DIAGNOSIS — Z0000 Encounter for general adult medical examination without abnormal findings: Secondary | ICD-10-CM | POA: Diagnosis not present

## 2018-09-06 DIAGNOSIS — E669 Obesity, unspecified: Secondary | ICD-10-CM | POA: Insufficient documentation

## 2018-09-06 DIAGNOSIS — E66812 Obesity, class 2: Secondary | ICD-10-CM

## 2018-09-06 LAB — UA/M W/RFLX CULTURE, ROUTINE
Bilirubin, UA: NEGATIVE
GLUCOSE, UA: NEGATIVE
KETONES UA: NEGATIVE
Leukocytes, UA: NEGATIVE
NITRITE UA: NEGATIVE
Protein, UA: NEGATIVE
RBC UA: NEGATIVE
SPEC GRAV UA: 1.02 (ref 1.005–1.030)
UUROB: 0.2 mg/dL (ref 0.2–1.0)
pH, UA: 7 (ref 5.0–7.5)

## 2018-09-06 MED ORDER — TRAZODONE HCL 50 MG PO TABS: 25.0000 mg | ORAL_TABLET | Freq: Every evening | ORAL | 1 refills | Status: AC | PRN

## 2018-09-06 NOTE — Assessment & Plan Note (Signed)
Significantly improved with trazodone. Continue current regimen

## 2018-09-06 NOTE — Assessment & Plan Note (Signed)
Discussed lifestyle modifications. Refused dietician referral. Continue to monitor

## 2018-09-06 NOTE — Progress Notes (Signed)
BP (!) 158/93 (BP Location: Left Arm, Patient Position: Sitting, Cuff Size: Large)   Pulse 81   Temp 97.8 F (36.6 C)   Ht 5' 11.1" (1.806 m)   Wt 282 lb 4 oz (128 kg)   SpO2 96%   BMI 39.26 kg/m    Subjective:    Patient ID: Carlos Holmes, male    DOB: 1960/11/13, 58 y.o.   MRN: [id number]  HPI: Carlos Holmes is a 58 y.o. male presenting on 09/06/2018 for comprehensive medical examination. Current medical complaints include:see below  Started on trazodone for sleep last month. Taking trazodone and an OTC sleep aid which is working well for him. No morning grogginess. Does have dry mouth from it but otherwise tolerating well and does not want to change dose.   Concerned about weight gain - 20 lb over last year. Does state his diet has been very poor lately which he feels is a major contributor. Does not exercise.   He currently lives with: wife Interim Problems from his last visit: no  Depression Screen done today and results listed below:  [facility] 08/02/2018 05/01/2016  Decreased Interest 0 3  Down, Depressed, Hopeless 0 0  PHQ - 2 Score 0 3  Altered sleeping 3 1  Tired, decreased energy 2 3  Change in appetite 0 0  Feeling bad or failure about yourself  0 0  Trouble concentrating 1 1  Moving slowly or fidgety/restless 0 1  Suicidal thoughts 0 0  PHQ-9 Score 6 9  Difficult doing work/chores Somewhat difficult Not difficult at all    The patient does not have a history of falls. I did complete a risk assessment for falls. A plan of care for falls was documented.   Past Medical History:  History reviewed. No pertinent past medical history.  Surgical History:  History reviewed. No pertinent surgical history.  Medications:  Current Outpatient Medications on File Prior to Visit  Medication Sig  . ONE DAILY MULTIPLE VITAMIN PO Take by mouth.   No current facility-administered medications on file prior to visit.     Allergies:  Allergies  Allergen  Reactions  . Sulfa Antibiotics Nausea And Vomiting and Rash    Social History:  Social History   Socioeconomic History  . Marital status: Married    Spouse name: Not on file  . Number of children: Not on file  . Years of education: Not on file  . Highest education level: Not on file  Occupational History  . Not on file  Social Needs  . Financial resource strain: Not on file  . Food insecurity:    Worry: Not on file    Inability: Not on file  . Transportation needs:    Medical: Not on file    Non-medical: Not on file  Tobacco Use  . Smoking status: Former Smoker    Types: Cigarettes    Last attempt to quit: 1984    Years since quitting: 36.0  . Smokeless tobacco: Never Used  Substance and Sexual Activity  . Alcohol use: Yes    Alcohol/week: 1.0 standard drinks    Types: 1 Glasses of wine per week  . Drug use: No  . Sexual activity: Not on file  Lifestyle  . Physical activity:    Days per week: Not on file    Minutes per session: Not on file  . Stress: Not on file  Relationships  . Social connections:    Talks on phone: Not  on file    Gets together: Not on file    Attends religious service: Not on file    Active member of club or organization: Not on file    Attends meetings of clubs or organizations: Not on file    Relationship status: Not on file  . Intimate partner violence:    Fear of current or ex partner: Not on file    Emotionally abused: Not on file    Physically abused: Not on file    Forced sexual activity: Not on file  Other Topics Concern  . Not on file  Social History Narrative  . Not on file   Social History   Tobacco Use  Smoking Status Former Smoker  . Types: Cigarettes  . Last attempt to quit: 1984  . Years since quitting: 36.0  Smokeless Tobacco Never Used   Social History   Substance and Sexual Activity  Alcohol Use Yes  . Alcohol/week: 1.0 standard drinks  . Types: 1 Glasses of wine per week    Family History:  Family  History  Problem Relation Age of Onset  . Heart disease Mother   . Diabetes Mother   . Hypertension Father   . Heart disease Maternal Uncle   . Heart disease Maternal Grandfather   . Cancer Paternal Grandmother     Past medical history, surgical history, medications, allergies, family history and social history reviewed with patient today and changes made to appropriate areas of the chart.   Review of Systems - General ROS: negative Psychological ROS: negative Ophthalmic ROS: negative ENT ROS: negative Allergy and Immunology ROS: negative Hematological and Lymphatic ROS: negative Endocrine ROS: negative Respiratory ROS: no cough, shortness of breath, or wheezing Cardiovascular ROS: no chest pain or dyspnea on exertion Gastrointestinal ROS: no abdominal pain, change in bowel habits, or black or bloody stools Genito-Urinary ROS: no dysuria, trouble voiding, or hematuria Musculoskeletal ROS: negative Neurological ROS: no TIA or stroke symptoms Dermatological ROS: negative All other ROS negative except what is listed above and in the HPI.      Objective:    BP (!) 158/93 (BP Location: Left Arm, Patient Position: Sitting, Cuff Size: Large)   Pulse 81   Temp 97.8 F (36.6 C)   Ht 5' 11.1" (1.806 m)   Wt 282 lb 4 oz (128 kg)   SpO2 96%   BMI 39.26 kg/m   Wt Readings from Last 3 Encounters:  09/06/18 282 lb 4 oz (128 kg)  08/02/18 278 lb 6 oz (126.3 kg)  12/09/16 263 lb (119.3 kg)    Physical Exam Vitals signs and nursing note reviewed.  Constitutional:      General: He is not in acute distress.    Appearance: He is well-developed.  HENT:     Head: Atraumatic.     Right Ear: External ear normal.     Left Ear: External ear normal.     Nose: Nose normal.  Eyes:     General: No scleral icterus.    Conjunctiva/sclera: Conjunctivae normal.     Pupils: Pupils are equal, round, and reactive to light.  Neck:     Musculoskeletal: Normal range of motion and neck supple.    Cardiovascular:     Rate and Rhythm: Normal rate and regular rhythm.     Heart sounds: Normal heart sounds. No murmur.  Pulmonary:     Effort: Pulmonary effort is normal. No respiratory distress.     Breath sounds: Normal breath sounds.  Abdominal:  General: Bowel sounds are normal. There is no distension.     Palpations: Abdomen is soft. There is no mass.     Tenderness: There is no abdominal tenderness. There is no guarding.  Genitourinary:    Comments: Declines prostate and genital exam Musculoskeletal: Normal range of motion.        General: No tenderness.  Skin:    General: Skin is warm and dry.     Findings: No rash.  Neurological:     General: No focal deficit present.     Mental Status: He is alert and oriented to person, place, and time.     Deep Tendon Reflexes: Reflexes are normal and symmetric.  Psychiatric:        Mood and Affect: Mood normal.        Behavior: Behavior normal.        Thought Content: Thought content normal.        Judgment: Judgment normal.     Results for orders placed or performed in visit on 09/06/18  UA/M w/rflx Culture, Routine  Result Value Ref Range   Specific Gravity, UA 1.020 1.005 - 1.030   pH, UA 7.0 5.0 - 7.5   Color, UA Yellow Yellow   Appearance Ur Clear Clear   Leukocytes, UA Negative Negative   Protein, UA Negative Negative/Trace   Glucose, UA Negative Negative   Ketones, UA Negative Negative   RBC, UA Negative Negative   Bilirubin, UA Negative Negative   Urobilinogen, Ur 0.2 0.2 - 1.0 mg/dL   Nitrite, UA Negative Negative      Assessment & Plan:   Problem List Items Addressed This Visit      Other   Prediabetes    Recheck A1C, lifestyle modifications reviewed.       Insomnia    Significantly improved with trazodone. Continue current regimen      Obesity    Discussed lifestyle modifications. Refused dietician referral. Continue to monitor       Other Visit Diagnoses    Annual physical exam    -   Primary   Relevant Orders   CBC with Differential/Platelet   Comprehensive metabolic panel   Lipid Panel w/o Chol/HDL Ratio   UA/M w/rflx Culture, Routine (Completed)   HgB A1c   Elevated blood-pressure reading without diagnosis of hypertension       Not improved on recheck. Discussed DASH diet, exercise, weight loss. Will recheck in 6 months and discuss meds if not improved       Discussed aspirin prophylaxis for myocardial infarction prevention and decision was it was not indicated  LABORATORY TESTING:  Health maintenance labs ordered today as discussed above.   The natural history of prostate cancer and ongoing controversy regarding screening and potential treatment outcomes of prostate cancer has been discussed with the patient. The meaning of a false positive PSA and a false negative PSA has been discussed. He indicates understanding of the limitations of this screening test and wishes not to proceed with screening PSA testing.   IMMUNIZATIONS:   - Tdap: Tetanus vaccination status reviewed: refused. - Influenza: Refused - Pneumovax: Not applicable - Prevnar: Not applicable - HPV: Not applicable - Zostavax vaccine: Refused  SCREENING: - Colonoscopy: Refused  Discussed with patient purpose of the colonoscopy is to detect colon cancer at curable precancerous or early stages   PATIENT COUNSELING:    Sexuality: Discussed sexually transmitted diseases, partner selection, use of condoms, avoidance of unintended pregnancy  and contraceptive alternatives.  Advised to avoid cigarette smoking.  I discussed with the patient that most people either abstain from alcohol or drink within safe limits (<=14/week and <=4 drinks/occasion for males, <=7/weeks and <= 3 drinks/occasion for females) and that the risk for alcohol disorders and other health effects rises proportionally with the number of drinks per week and how often a drinker exceeds daily limits.  Discussed cessation/primary  prevention of drug use and availability of treatment for abuse.   Diet: Encouraged to adjust caloric intake to maintain  or achieve ideal body weight, to reduce intake of dietary saturated fat and total fat, to limit sodium intake by avoiding high sodium foods and not adding table salt, and to maintain adequate dietary potassium and calcium preferably from fresh fruits, vegetables, and low-fat dairy products.    stressed the importance of regular exercise  Injury prevention: Discussed safety belts, safety helmets, smoke detector, smoking near bedding or upholstery.   Dental health: Discussed importance of regular tooth brushing, flossing, and dental visits.   Follow up plan: NEXT PREVENTATIVE PHYSICAL DUE IN 1 YEAR. Return in about 6 months (around 03/07/2019) for BP, sleep, weight f/u.

## 2018-09-06 NOTE — Assessment & Plan Note (Signed)
Recheck A1C, lifestyle modifications reviewed.

## 2018-09-07 LAB — CBC WITH DIFFERENTIAL/PLATELET
BASOS ABS: 0.1 10*3/uL (ref 0.0–0.2)
Basos: 1 %
EOS (ABSOLUTE): 0.2 10*3/uL (ref 0.0–0.4)
Eos: 3 %
HEMOGLOBIN: 13.5 g/dL (ref 13.0–17.7)
Hematocrit: 40.1 % (ref 37.5–51.0)
IMMATURE GRANS (ABS): 0 10*3/uL (ref 0.0–0.1)
IMMATURE GRANULOCYTES: 1 %
LYMPHS: 24 %
Lymphocytes Absolute: 1.5 10*3/uL (ref 0.7–3.1)
MCH: 28.9 pg (ref 26.6–33.0)
MCHC: 33.7 g/dL (ref 31.5–35.7)
MCV: 86 fL (ref 79–97)
MONOCYTES: 10 %
Monocytes Absolute: 0.6 10*3/uL (ref 0.1–0.9)
NEUTROS ABS: 3.9 10*3/uL (ref 1.4–7.0)
Neutrophils: 61 %
Platelets: 237 10*3/uL (ref 150–450)
RBC: 4.67 x10E6/uL (ref 4.14–5.80)
RDW: 13.1 % (ref 11.6–15.4)
WBC: 6.2 10*3/uL (ref 3.4–10.8)

## 2018-09-07 LAB — LIPID PANEL W/O CHOL/HDL RATIO
Cholesterol, Total: 190 mg/dL (ref 100–199)
HDL: 36 mg/dL — AB (ref >39)
LDL CALC: 106 mg/dL — AB (ref 0–99)
Triglycerides: 242 mg/dL — ABNORMAL HIGH (ref 0–149)
VLDL Cholesterol Cal: 48 mg/dL — ABNORMAL HIGH (ref 5–40)

## 2018-09-07 LAB — COMPREHENSIVE METABOLIC PANEL
ALBUMIN: 4.2 g/dL (ref 3.5–5.5)
ALT: 21 IU/L (ref 0–44)
AST: 20 IU/L (ref 0–40)
Albumin/Globulin Ratio: 1.7 (ref 1.2–2.2)
Alkaline Phosphatase: 83 IU/L (ref 39–117)
BUN / CREAT RATIO: 15 (ref 9–20)
BUN: 16 mg/dL (ref 6–24)
Bilirubin Total: 0.4 mg/dL (ref 0.0–1.2)
CALCIUM: 9.1 mg/dL (ref 8.7–10.2)
CO2: 22 mmol/L (ref 20–29)
CREATININE: 1.09 mg/dL (ref 0.76–1.27)
Chloride: 103 mmol/L (ref 96–106)
GFR, EST AFRICAN AMERICAN: 86 mL/min/{1.73_m2} (ref >59)
GFR, EST NON AFRICAN AMERICAN: 74 mL/min/{1.73_m2} (ref >59)
GLUCOSE: 106 mg/dL — AB (ref 65–99)
Globulin, Total: 2.5 g/dL (ref 1.5–4.5)
Potassium: 4.9 mmol/L (ref 3.5–5.2)
Sodium: 141 mmol/L (ref 134–144)
TOTAL PROTEIN: 6.7 g/dL (ref 6.0–8.5)

## 2018-09-07 LAB — HEMOGLOBIN A1C
ESTIMATED AVERAGE GLUCOSE: 117 mg/dL
Hgb A1c MFr Bld: 5.7 % — ABNORMAL HIGH (ref 4.8–5.6)

## 2019-03-08 ENCOUNTER — Ambulatory Visit: Payer: BLUE CROSS/BLUE SHIELD | Admitting: Family Medicine

## 2019-10-13 ENCOUNTER — Ambulatory Visit: Payer: Self-pay | Admitting: *Deleted

## 2019-10-13 ENCOUNTER — Other Ambulatory Visit: Payer: Self-pay

## 2019-10-13 DIAGNOSIS — I714 Abdominal aortic aneurysm, without rupture: Secondary | ICD-10-CM | POA: Diagnosis not present

## 2019-10-13 DIAGNOSIS — Z79899 Other long term (current) drug therapy: Secondary | ICD-10-CM | POA: Diagnosis not present

## 2019-10-13 DIAGNOSIS — R1030 Lower abdominal pain, unspecified: Secondary | ICD-10-CM | POA: Diagnosis not present

## 2019-10-13 DIAGNOSIS — K5792 Diverticulitis of intestine, part unspecified, without perforation or abscess without bleeding: Secondary | ICD-10-CM | POA: Diagnosis not present

## 2019-10-13 DIAGNOSIS — Z87891 Personal history of nicotine dependence: Secondary | ICD-10-CM | POA: Diagnosis not present

## 2019-10-13 DIAGNOSIS — K5732 Diverticulitis of large intestine without perforation or abscess without bleeding: Secondary | ICD-10-CM | POA: Diagnosis not present

## 2019-10-13 DIAGNOSIS — K625 Hemorrhage of anus and rectum: Secondary | ICD-10-CM | POA: Diagnosis not present

## 2019-10-13 LAB — CBC
HCT: 41.4 % (ref 39.0–52.0)
Hemoglobin: 13.3 g/dL (ref 13.0–17.0)
MCH: 28.5 pg (ref 26.0–34.0)
MCHC: 32.1 g/dL (ref 30.0–36.0)
MCV: 88.8 fL (ref 80.0–100.0)
Platelets: 232 10*3/uL (ref 150–400)
RBC: 4.66 MIL/uL (ref 4.22–5.81)
RDW: 13.2 % (ref 11.5–15.5)
WBC: 12.6 10*3/uL — ABNORMAL HIGH (ref 4.0–10.5)
nRBC: 0 % (ref 0.0–0.2)

## 2019-10-13 LAB — COMPREHENSIVE METABOLIC PANEL
ALT: 21 U/L (ref 0–44)
AST: 16 U/L (ref 15–41)
Albumin: 4.1 g/dL (ref 3.5–5.0)
Alkaline Phosphatase: 70 U/L (ref 38–126)
Anion gap: 8 (ref 5–15)
BUN: 16 mg/dL (ref 6–20)
CO2: 28 mmol/L (ref 22–32)
Calcium: 8.9 mg/dL (ref 8.9–10.3)
Chloride: 102 mmol/L (ref 98–111)
Creatinine, Ser: 1.04 mg/dL (ref 0.61–1.24)
GFR calc Af Amer: >60 mL/min (ref >60)
GFR calc non Af Amer: >60 mL/min (ref >60)
Glucose, Bld: 96 mg/dL (ref 70–99)
Potassium: 4.5 mmol/L (ref 3.5–5.1)
Sodium: 138 mmol/L (ref 135–145)
Total Bilirubin: 0.7 mg/dL (ref 0.3–1.2)
Total Protein: 8 g/dL (ref 6.5–8.1)

## 2019-10-13 LAB — LIPASE, BLOOD: Lipase: 35 U/L (ref 11–51)

## 2019-10-13 MED ORDER — SODIUM CHLORIDE 0.9% FLUSH: 3.0000 mL | Freq: Once | INTRAVENOUS | Status: DC

## 2019-10-13 NOTE — Telephone Encounter (Signed)
Patient with history of IBS with two week history of lower abdominal pain.  Pain was sudden and excruciating upon onset.  Pain score today 5 with escalating up to max of 7.  Reporting blood of both bright red and dark red with bowel movements only x 3 days.  Patient denies nausea / vomiting, uncertain if has fever, but c/o feeling cold.  Denies weakness or dizziness.  Care Advice given per protocol. Instructed to proceed to the nearest ER.  Wife to accompany patient.    Reason for Disposition . Blood in bowel movements  (Exception: Blood on surface of BM with constipation)  Answer Assessment - Initial Assessment Questions 1. LOCATION: "Where does it hurt?"      Abdominal pain x 2 weeks with pain progressively getting worse.   2. RADIATION: "Does the pain shoot anywhere else?" (e.g., chest, back)     No  3. ONSET: "When did the pain begin?" (Minutes, hours or days ago)      2 weeks today 4. SUDDEN: "Gradual or sudden onset?"    Sudden  5. PATTERN "Does the pain come and go, or is it constant?"    - If constant: "Is it getting better, staying the same, or worsening?"      (Note: Constant means the pain never goes away completely; most serious pain is constant and it progresses)     - If intermittent: "How long does it last?" "Do you have pain now?"     (Note: Intermittent means the pain goes away completely between bouts)     Pain concentrated in lower abdomen.  Gut hurts - hard to straighten up.   6. SEVERITY: "How bad is the pain?"  (e.g., Scale 1-10; mild, moderate, or severe)    - MILD (1-3): doesn't interfere with normal activities, abdomen soft and not tender to touch     - MODERATE (4-7): interferes with normal activities or awakens from sleep, tender to touch     - SEVERE (8-10): excruciating pain, doubled over, unable to do any normal activities    Pain Score:     5 - 7  (Two weeks ago a 10) 7. RECURRENT SYMPTOM: "Have you ever had this type of abdominal pain before?" If so, ask:  "When was the last time?" and "What happened that time?"     "I have had IBS for years." 8. CAUSE: "What do you think is causing the abdominal pain?"     Never had anything drawn out this long before.  "Worst I have ever had in my entire life."   9. RELIEVING/AGGRAVATING FACTORS: "What makes it better or worse?" (e.g., movement, antacids, bowel movement)     Done nothing  10. OTHER SYMPTOMS: "Has there been any vomiting, diarrhea, constipation, or urine problems?"       Bleeding of dark and bright red blood with bowel movements only x 3 days.  Bleeding mainly on toilet paper.  Patient sure if he has a fever or not, but reports feeling cold.  Protocols used: ABDOMINAL PAIN - MALE-A-AH

## 2019-10-13 NOTE — ED Triage Notes (Signed)
Pt comes via POV from home with c.o abdominal pain and rectal bleeding. Pt states he has IBS and two weeks ago he had a massive attack.  Pt states it since has progressed and gotten worse. Pt states lower abdominal pain.

## 2019-10-14 ENCOUNTER — Emergency Department: Payer: BC Managed Care – PPO

## 2019-10-14 ENCOUNTER — Emergency Department
Admission: EM | Admit: 2019-10-14 | Discharge: 2019-10-14 | Disposition: A | Discharge status: Home / Self Care | Payer: BC Managed Care – PPO | Attending: Emergency Medicine | Admitting: Emergency Medicine

## 2019-10-14 DIAGNOSIS — K5792 Diverticulitis of intestine, part unspecified, without perforation or abscess without bleeding: Secondary | ICD-10-CM

## 2019-10-14 DIAGNOSIS — I714 Abdominal aortic aneurysm, without rupture, unspecified: Secondary | ICD-10-CM

## 2019-10-14 DIAGNOSIS — K625 Hemorrhage of anus and rectum: Secondary | ICD-10-CM

## 2019-10-14 DIAGNOSIS — R109 Unspecified abdominal pain: Secondary | ICD-10-CM | POA: Diagnosis not present

## 2019-10-14 HISTORY — DX: Irritable bowel syndrome without diarrhea: K58.9

## 2019-10-14 MED ORDER — ONDANSETRON 4 MG PO TBDP: ORAL_TABLET | ORAL | 0 refills | Status: AC

## 2019-10-14 MED ORDER — IOHEXOL 300 MG/ML  SOLN
125.0000 mL | Freq: Once | INTRAMUSCULAR | Status: AC | PRN
  Administered 2019-10-14: 03:00:00 125 mL via INTRAVENOUS

## 2019-10-14 MED ORDER — AMOXICILLIN-POT CLAVULANATE 875-125 MG PO TABS: 1.0000 | ORAL_TABLET | Freq: Three times a day (TID) | ORAL | 0 refills | Status: AC

## 2019-10-14 MED ORDER — AMOXICILLIN-POT CLAVULANATE 875-125 MG PO TABS
1.0000 | ORAL_TABLET | ORAL | Status: AC
  Administered 2019-10-14: 05:00:00 1 via ORAL
  Filled 2019-10-14: qty 1

## 2019-10-14 NOTE — Discharge Instructions (Addendum)
We believe your symptoms are caused by diverticulitis.  Most of the time this condition (please read through the included information) can be cured with outpatient antibiotics.  Please take the full course of prescribed medication(s) and follow up with the doctors recommended above.  Take Tylenol (acetaminophen) 1000 mg by mouth every 8 hours as needed for pain (but no more than that).  Also, as we discussed, an incidental finding on your CT scan was that of an abdominal aortic aneurysm below your kidneys measuring about 3.1 cm in diameter.  The radiologist recommended that you have a follow-up ultrasound in 1 year to make sure it is not getting any bigger.  You can live the rest of your life with this, but it is important to know about because if it is getting bigger you may require surgery because it can become dangerous.  Return to the ED if your abdominal pain worsens or fails to improve, you develop bloody vomiting, bloody diarrhea, you are unable to tolerate fluids due to vomiting, fever greater than 101, or other symptoms that concern you.

## 2019-10-14 NOTE — ED Provider Notes (Signed)
[facility] Emergency Department Provider Note  ____________________________________________   First MD Initiated Contact with Patient 10/14/19 [phone]     (approximate)  Carlos have reviewed the triage vital signs and the nursing notes.   HISTORY  Chief Complaint Abdominal Pain and Rectal Bleeding    HPI Carlos Holmes is a 59 y.o. male who reports few past medical issues, only reporting IBS and who has obesity and takes no medications daily other than a multivitamin.  He presents for evaluation of persistent lower abdominal pain and about 3 days of rectal bleeding.  The pain started about 2 weeks ago and lasted a few days and was very severe.  That has improved and he attributed to an IBS "flare", but he still having some pain in the lower middle part of his abdomen.  Nothing in particular makes it better or worse although it is now mild.  However about 3 days ago he started noticing a small amount of blood in his stool.  He has not had increased urinary frequency and no diarrhea, but now the amount of blood is increasing and he has noticed some clots in his stool as well as what appears to be red blood.  He has not noticed dark-colored or black/tarry stool.  He denies fever/chills, sore throat, chest pain, cough, shortness of breath, nausea, vomiting, and dysuria.   He has no history of GI bleeding.  He has no known history of hemorrhoids.  He has never had a colonoscopy because he did not want to have one.  He has not on blood thinners.        Past Medical History:  Diagnosis Date  . IBS (irritable bowel syndrome)     Patient Active Problem List   Diagnosis Date Noted  . Obesity 09/06/2018  . Insomnia 08/05/2018  . Prediabetes 08/03/2018    History reviewed. No pertinent surgical history.  Prior to Admission medications   Medication Sig Start Date End Date Taking? Authorizing Provider  amoxicillin-clavulanate (AUGMENTIN) 875-125 MG tablet Take 1 tablet  by mouth every 8 (eight) hours for 12 days. 10/14/19 10/26/19  Loleta Rose, MD  ondansetron (ZOFRAN ODT) 4 MG disintegrating tablet Allow 1-2 tablets to dissolve in your mouth every 8 hours as needed for nausea/vomiting 10/14/19   Loleta Rose, MD  ONE DAILY MULTIPLE VITAMIN PO Take by mouth.    [provider]  traZODone (DESYREL) 50 MG tablet Take 0.5-1 tablets (25-50 mg total) by mouth at bedtime as needed for sleep. 09/06/18   Particia Nearing, PA-C    Allergies Sulfa antibiotics  Family History  Problem Relation Age of Onset  . Heart disease Mother   . Diabetes Mother   . Hypertension Father   . Heart disease Maternal Uncle   . Heart disease Maternal Grandfather   . Cancer Paternal Grandmother     Social History Social History   Tobacco Use  . Smoking status: Former Smoker    Types: Cigarettes    Quit date: 1984    Years since quitting: 37.1  . Smokeless tobacco: Never Used  Substance Use Topics  . Alcohol use: Yes    Alcohol/week: 1.0 standard drinks    Types: 1 Glasses of wine per week  . Drug use: No    Review of Systems Constitutional: No fever/chills Eyes: No visual changes. ENT: No sore throat. Cardiovascular: Denies chest pain. Respiratory: Denies shortness of breath. Gastrointestinal: Abdominal pain and rectal bleeding as described above. Genitourinary: Negative for dysuria.  Musculoskeletal: Negative for neck pain.  Negative for back pain. Integumentary: Negative for rash. Neurological: Negative for headaches, focal weakness or numbness.   ____________________________________________   PHYSICAL EXAM:  VITAL SIGNS: ED Triage Vitals  Enc Vitals Group     BP 10/13/19 1614 (!) 151/90     Pulse Rate 10/13/19 1614 92     Resp 10/13/19 1614 18     Temp 10/13/19 1614 98.6 F (37 C)     Temp Source 10/13/19 1844 Oral     SpO2 10/13/19 1614 94 %     Weight 10/13/19 1612 122.5 kg (270 lb)     Height 10/13/19 1612 1.803 m (5\' 11" )      Head Circumference --      Peak Flow --      Pain Score 10/13/19 1612 6     Pain Loc --      Pain Edu? --      Excl. in [place]? --     Constitutional: Alert and oriented.  Does not appear to be in acute distress at this time. Eyes: Conjunctivae are normal.  Head: Atraumatic. Nose: No congestion/rhinnorhea. Mouth/Throat: Patient is wearing a mask. Neck: No stridor.  No meningeal signs.   Cardiovascular: Normal rate, regular rhythm. Good peripheral circulation. Grossly normal heart sounds. Respiratory: Normal respiratory effort.  No retractions. Gastrointestinal: Soft and nontender except at the very lower middle part of his abdomen which is very mildly tender.  No rebound and no guarding. Rectal: No external hemorrhoids.  Light-colored stool in the rectal vault which is weakly heme positive.  Quality control passed.  Musculoskeletal: No lower extremity tenderness nor edema. No gross deformities of extremities. Neurologic:  Normal speech and language. No gross focal neurologic deficits are appreciated.  Skin:  Skin is warm, dry and intact. Psychiatric: Mood and affect are normal. Speech and behavior are normal.  ____________________________________________   LABS (all labs ordered are listed, but only abnormal results are displayed)  Labs Reviewed  CBC - Abnormal; Notable for the following components:      Result Value   WBC 12.6 (*)    All other components within normal limits  LIPASE, BLOOD  COMPREHENSIVE METABOLIC PANEL  URINALYSIS, COMPLETE (UACMP) WITH MICROSCOPIC   ____________________________________________  EKG  No indication for emergent EKG ____________________________________________  RADIOLOGY Ursula Alert, personally viewed and evaluated these images (plain radiographs) as part of my medical decision making, as well as reviewing the written report by the radiologist.  ED MD interpretation:  Uncomplicated diverticulitis.  Abdominal aortic aneurysm.  Official  radiology report(s): CT ABDOMEN PELVIS W CONTRAST  Result Date: 10/14/2019 CLINICAL DATA:  Diverticulitis suspected, abdominal pain and rectal bleeding EXAM: CT ABDOMEN AND PELVIS WITH CONTRAST TECHNIQUE: Multidetector CT imaging of the abdomen and pelvis was performed using the standard protocol following bolus administration of intravenous contrast. CONTRAST:  141mL OMNIPAQUE IOHEXOL 300 MG/ML  SOLN COMPARISON:  None. FINDINGS: Lower chest: Bandlike atelectasis in the lingula. Lung bases are otherwise clear. Normal heart size. No pericardial effusion. Few coronary artery calcifications are present. Hepatobiliary: Small subcapsular 1.5 cm fluid attenuation cyst in the right lobe liver. No focal liver abnormality is seen. No gallstones, gallbladder wall thickening, or biliary dilatation. Pancreas: Unremarkable. No pancreatic ductal dilatation or surrounding inflammatory changes. Spleen: Normal in size without focal abnormality. Adrenals/Urinary Tract: Mild thickening of the left adrenal gland without discrete nodularity. Mild bilateral symmetric perinephric stranding, a nonspecific finding though may correlate with either age or decreased renal function. Kidneys  are otherwise unremarkable, without renal calculi, suspicious lesion, or hydronephrosis. Bladder is unremarkable. Stomach/Bowel: Distal esophagus, stomach and duodenal sweep are unremarkable. No small bowel wall thickening or dilatation. No evidence of obstruction. A normal appendix is visualized. Focally thickened segment of the sigmoid colon with adjacent phlegmonous change centered upon a culprit diverticulum in the left lower quadrant (5/46). No extraluminal gas, organized abscess or collection is seen. Vascular/Lymphatic: Atherosclerotic plaque within the normal caliber aorta. Focal broad-based saccular aneurysmal outpouching of the infrarenal abdominal aorta measuring up to 3.1 cm in maximal diameter with calcified and noncalcified mural plaque. No  extension into the iliac arteries. No suspicious or pathologically enlarged lymph nodes in the included lymphatic chains. Reactive adenopathy is present in the left lower quadrant. Reproductive: The prostate and seminal vesicles are unremarkable. Other: Phlegmonous change in the left lower quadrant, as detailed above. No free fluid. No free air. Small fat containing umbilical hernia with a 1.1 by 1.2 cm hernia neck. No bowel containing hernias. Musculoskeletal: No acute osseous abnormality or suspicious osseous lesion. IMPRESSION: 1. Acute, uncomplicated sigmoid diverticulitis with adjacent phlegmon. 2. Focal broad-based saccular aneurysmal outpouching of the infrarenal abdominal aorta measuring up to 3.1 cm in maximal diameter. Recommend follow-up by ultrasound in 1 year. This recommendation follows ACR consensus guidelines: White Paper of the ACR Incidental Findings Committee II on Vascular Findings. J Am Coll Radiol 2013; 10: 3. Aortic Atherosclerosis (ICD10-I70.0). Electronically Signed   By: Kreg Shropshire M.D.   On: 10/14/2019 04:00    ____________________________________________   PROCEDURES   Procedure(s) performed (including Critical Care):  Procedures   ____________________________________________   INITIAL IMPRESSION / MDM / ASSESSMENT AND PLAN / ED COURSE  As part of my medical decision making, Carlos reviewed the following data within the electronic MEDICAL RECORD NUMBER Nursing notes reviewed and incorporated, Labs reviewed , Old chart reviewed and Notes from prior ED visits   Differential diagnosis includes, but is not limited to, diverticular bleed, AV malformation, neoplasm, diverticulitis that occurred 2 weeks ago with a developing abscess, colitis, mesenteric ischemia.  The patient does not appear to be in acute distress and the worst of his pain was 2 weeks ago, but he could have had a diverticulitis that perforated and developed an abscess.  The rectal bleeding is again most likely  diverticular but it is unusual for him and he has never had a colonoscopy.  His lab work is reassuring with no anemia and normal metabolic panel.  He is not having any nausea or vomiting.  We agreed that outpatient follow-up is most likely what is going to happen but given his history and persistent symptoms we will obtain a CT scan of the abdomen and pelvis with IV contrast to evaluate for any acute or emergent findings.  If there are no emergent findings, he will follow up with GI.      Clinical Course as of Oct 13 432  Fri Oct 14, 2019  0422 Acute uncomplicated diverticulitis.  This could be the cause of the rectal bleeding he is experiencing as well.  After almost 13 hours in the emergency department he remains hemodynamically stable and in no acute distress.He is comfortable with the plan for outpatient antibiotic treatment and GI follow-up.  Carlos gave my usual and customary diverticulitis management recommendations and return precautions.  He is receiving a first dose of Augmentin in the ED and prescription as listed below.  CT ABDOMEN PELVIS W CONTRAST [CF]  0430 Of note, the patient also has  a saccular aneurysm on the abdominal aorta.  Carlos informed him about the radiology recommendation to have an ultrasound in about a year.   [CF]    Clinical Course User Index [CF] Loleta Rose, MD     ____________________________________________  FINAL CLINICAL IMPRESSION(S) / ED DIAGNOSES  Final diagnoses:  Rectal bleeding  Diverticulitis  Abdominal aortic aneurysm (AAA) 3.0 cm to 5.5 cm in diameter in male [facility])     MEDICATIONS GIVEN DURING THIS VISIT:  Medications  sodium chloride flush (NS) 0.9 % injection 3 mL (has no administration in time range)  amoxicillin-clavulanate (AUGMENTIN) 875-125 MG per tablet 1 tablet (has no administration in time range)  iohexol (OMNIPAQUE) 300 MG/ML solution 125 mL (125 mLs Intravenous Contrast Given 10/14/19 0326)     ED Discharge Orders          Ordered    amoxicillin-clavulanate (AUGMENTIN) 875-125 MG tablet  Every 8 hours     10/14/19 0427    ondansetron (ZOFRAN ODT) 4 MG disintegrating tablet     10/14/19 6269          *Please note:  Louise Victory was evaluated in Emergency Department on 10/14/2019 for the symptoms described in the history of present illness. He was evaluated in the context of the global COVID-19 pandemic, which necessitated consideration that the patient might be at risk for infection with the SARS-CoV-2 virus that causes COVID-19. Institutional protocols and algorithms that pertain to the evaluation of patients at risk for COVID-19 are in a state of rapid change based on information released by regulatory bodies including the CDC and federal and state organizations. These policies and algorithms were followed during the patient's care in the ED.  Some ED evaluations and interventions may be delayed as a result of limited staffing during the pandemic.*  Note:  This document was prepared using Dragon voice recognition software and may include unintentional dictation errors.   Loleta Rose, MD 10/14/19 [phone]

## 2019-10-14 NOTE — ED Notes (Signed)
Pt denies any N/V/D sx and states at this time his pain is tolerable

## 2019-10-31 ENCOUNTER — Other Ambulatory Visit: Payer: Self-pay

## 2019-10-31 ENCOUNTER — Ambulatory Visit: Payer: BC Managed Care – PPO | Admitting: Gastroenterology

## 2019-10-31 VITALS — BP 157/94 | HR 99 | Temp 98.0°F | Ht 71.0 in | Wt 286.0 lb

## 2019-10-31 DIAGNOSIS — R933 Abnormal findings on diagnostic imaging of other parts of digestive tract: Secondary | ICD-10-CM

## 2019-10-31 DIAGNOSIS — K5732 Diverticulitis of large intestine without perforation or abscess without bleeding: Secondary | ICD-10-CM | POA: Diagnosis not present

## 2019-10-31 NOTE — Progress Notes (Signed)
[name] MD, MRCP(U.K) [address]  [address]  [place], [place] [address]  Main: [phone]  Fax: [phone]   Gastroenterology Consultation  Referring Provider:     Particia Nearing,* Primary Care Physician:  Particia Nearing, [place] Primary Gastroenterologist:  Dr. [name]  Reason for Consultation:   Diverticulitis        HPI:   Carlos Holmes is a 59 y.o. y/o male referred for consultation & management  by Particia Nearing, PA-C.    He presented to the emergency room on 10/14/2019 with abdominal pain and rectal bleeding of 3 days duration.  Never had a colonoscopy in the past.  Underwent a CT scan of the abdomen that demonstrated acute uncomplicated sigmoid diverticulitis with adjacent phlegmon.  Focal broad-based saccular aneurysmal outpouching of the infrarenal abdominal aorta measuring up to 3.1 cm in maximal diameter.  Recommend follow-up in 1 year.  Discharged home to follow-up with GI.  White cell count was 12.6 with a hemoglobin of 13.3 g.  CMP was normal.  Lipase was normal.  He was given 12 days of antibiotics and since then has no abdominal pain, no fever no rectal bleeding.  Feels well.  Never had a colonoscopy.  No family history of colon cancer or polyps.  Past Medical History:  Diagnosis Date  . IBS (irritable bowel syndrome)     No past surgical history on file.  Prior to Admission medications   Medication Sig Start Date End Date Taking? Authorizing Provider  ondansetron (ZOFRAN ODT) 4 MG disintegrating tablet Allow 1-2 tablets to dissolve in your mouth every 8 hours as needed for nausea/vomiting 10/14/19   Loleta Rose, MD  ONE DAILY MULTIPLE VITAMIN PO Take by mouth.    [provider]  traZODone (DESYREL) 50 MG tablet Take 0.5-1 tablets (25-50 mg total) by mouth at bedtime as needed for sleep. 09/06/18   Particia Nearing, PA-C    Family History  Problem Relation Age of Onset  . Heart disease Mother   .  Diabetes Mother   . Hypertension Father   . Heart disease Maternal Uncle   . Heart disease Maternal Grandfather   . Cancer Paternal Grandmother      Social History   Tobacco Use  . Smoking status: Former Smoker    Types: Cigarettes    Quit date: 1984    Years since quitting: 37.1  . Smokeless tobacco: Never Used  Substance Use Topics  . Alcohol use: Yes    Alcohol/week: 1.0 standard drinks    Types: 1 Glasses of wine per week  . Drug use: No    Allergies as of 10/31/2019 - Review Complete 10/14/2019  Allergen Reaction Noted  . Sulfa antibiotics Nausea And Vomiting and Rash 05/01/2016    Review of Systems:    All systems reviewed and negative except where noted in HPI.   Physical Exam:  There were no vitals taken for this visit. No LMP for male patient. Psych:  Alert and cooperative. Normal mood and affect. General:   Alert,  Well-developed, well-nourished, pleasant and cooperative in NAD Head:  Normocephalic and atraumatic. Eyes:  Sclera clear, no icterus.   Conjunctiva pink. Lungs:  Respirations even and unlabored.  Clear throughout to auscultation.   No wheezes, crackles, or rhonchi. No acute distress. Heart:  Regular rate and rhythm; no murmurs, clicks, rubs, or gallops. Abdomen:  Normal bowel sounds.  No bruits.  Soft, non-tender and non-distended without masses, hepatosplenomegaly or hernias noted.  No guarding or rebound tenderness.    Neurologic:  Alert and oriented x3;  grossly normal neurologically. Psych:  Alert and cooperative. Normal mood and affect.  Imaging Studies: CT ABDOMEN PELVIS W CONTRAST  Result Date: 10/14/2019 CLINICAL DATA:  Diverticulitis suspected, abdominal pain and rectal bleeding EXAM: CT ABDOMEN AND PELVIS WITH CONTRAST TECHNIQUE: Multidetector CT imaging of the abdomen and pelvis was performed using the standard protocol following bolus administration of intravenous contrast. CONTRAST:  1106mL OMNIPAQUE IOHEXOL 300 MG/ML  SOLN COMPARISON:   None. FINDINGS: Lower chest: Bandlike atelectasis in the lingula. Lung bases are otherwise clear. Normal heart size. No pericardial effusion. Few coronary artery calcifications are present. Hepatobiliary: Small subcapsular 1.5 cm fluid attenuation cyst in the right lobe liver. No focal liver abnormality is seen. No gallstones, gallbladder wall thickening, or biliary dilatation. Pancreas: Unremarkable. No pancreatic ductal dilatation or surrounding inflammatory changes. Spleen: Normal in size without focal abnormality. Adrenals/Urinary Tract: Mild thickening of the left adrenal gland without discrete nodularity. Mild bilateral symmetric perinephric stranding, a nonspecific finding though may correlate with either age or decreased renal function. Kidneys are otherwise unremarkable, without renal calculi, suspicious lesion, or hydronephrosis. Bladder is unremarkable. Stomach/Bowel: Distal esophagus, stomach and duodenal sweep are unremarkable. No small bowel wall thickening or dilatation. No evidence of obstruction. A normal appendix is visualized. Focally thickened segment of the sigmoid colon with adjacent phlegmonous change centered upon a culprit diverticulum in the left lower quadrant (5/46). No extraluminal gas, organized abscess or collection is seen. Vascular/Lymphatic: Atherosclerotic plaque within the normal caliber aorta. Focal broad-based saccular aneurysmal outpouching of the infrarenal abdominal aorta measuring up to 3.1 cm in maximal diameter with calcified and noncalcified mural plaque. No extension into the iliac arteries. No suspicious or pathologically enlarged lymph nodes in the included lymphatic chains. Reactive adenopathy is present in the left lower quadrant. Reproductive: The prostate and seminal vesicles are unremarkable. Other: Phlegmonous change in the left lower quadrant, as detailed above. No free fluid. No free air. Small fat containing umbilical hernia with a 1.1 by 1.2 cm hernia neck.  No bowel containing hernias. Musculoskeletal: No acute osseous abnormality or suspicious osseous lesion. IMPRESSION: 1. Acute, uncomplicated sigmoid diverticulitis with adjacent phlegmon. 2. Focal broad-based saccular aneurysmal outpouching of the infrarenal abdominal aorta measuring up to 3.1 cm in maximal diameter. Recommend follow-up by ultrasound in 1 year. This recommendation follows ACR consensus guidelines: White Paper of the ACR Incidental Findings Committee II on Vascular Findings. J Am Coll Radiol 2013; 10: 3. Aortic Atherosclerosis (ICD10-I70.0). Electronically Signed   By: Lovena Le M.D.   On: 10/14/2019 04:00    Assessment and Plan:   Carlos Holmes is a 59 y.o. y/o male presented to the emergency room on 10/13/2019 with abdominal pain and rectal bleeding.  CT scan of the abdomen showed uncomplicated sigmoid diverticulitis with a phlegmon.  Never had a colonoscopy in the past.  Subsequently referred to follow-up with GI.  Plan 1.  Repeat CT scan of the abdomen and pelvis in 6 weeks to check for resolution of the phlegmon.  If resolved will proceed with colonoscopy to rule out any luminal lesion.  I have discussed alternative options, risks & benefits,  which include, but are not limited to, bleeding, infection, perforation,respiratory complication & drug reaction.  The patient agrees with this plan & written consent will be obtained.    Follow up as needed   Dr Jonathon Bellows MD,MRCP(U.K)

## 2019-12-16 ENCOUNTER — Ambulatory Visit
Admission: RE | Admit: 2019-12-16 | Discharge: 2019-12-16 | Disposition: A | Discharge status: Home / Self Care | Payer: BC Managed Care – PPO | Source: Ambulatory Visit | Attending: Gastroenterology | Admitting: Gastroenterology

## 2019-12-16 ENCOUNTER — Other Ambulatory Visit: Payer: Self-pay

## 2019-12-16 DIAGNOSIS — I714 Abdominal aortic aneurysm, without rupture: Secondary | ICD-10-CM | POA: Diagnosis not present

## 2019-12-16 DIAGNOSIS — R933 Abnormal findings on diagnostic imaging of other parts of digestive tract: Secondary | ICD-10-CM | POA: Insufficient documentation

## 2019-12-16 DIAGNOSIS — K5790 Diverticulosis of intestine, part unspecified, without perforation or abscess without bleeding: Secondary | ICD-10-CM | POA: Diagnosis not present

## 2019-12-16 LAB — POCT I-STAT CREATININE: Creatinine, Ser: 1 mg/dL (ref 0.61–1.24)

## 2019-12-16 MED ORDER — IOHEXOL 300 MG/ML  SOLN
100.0000 mL | Freq: Once | INTRAMUSCULAR | Status: AC | PRN
  Administered 2019-12-16: 08:00:00 100 mL via INTRAVENOUS

## 2019-12-19 ENCOUNTER — Encounter: Payer: Self-pay | Admitting: Gastroenterology

## 2019-12-22 ENCOUNTER — Telehealth: Payer: Self-pay

## 2019-12-22 NOTE — Telephone Encounter (Signed)
Spoke with pt and informed him of CT scan result and Dr. Johnney Killian recommendation. Pt states he doesn't want to proceed with the colonoscopy at this time as he feels he's no longer having symptoms. Pt plans to contact our office if he changes his mind.

## 2019-12-22 NOTE — Telephone Encounter (Signed)
-----   Message from Kiran Anna, MD sent at 12/19/2019 12:31 PM EDT ----- Carlos Holmes   Inform with the diverticulitis has resolved.  Please proceed with colonoscopy.  C/c Lane, [name], PA-C to follow-up on abdominal aortic aneurysm which requires a follow-up imaging as recommended in 2 years  Dr Kiran Anna MD,MRCP (U.K) Gastroenterology/Hepatology Pager: [phone]  

## 2019-12-22 NOTE — Telephone Encounter (Signed)
Called pt to inform him of CT result and Dr. Johnney Killian directions to proceed with the colonoscopy.  Unable to contact, LVM to return call

## 2019-12-22 NOTE — Telephone Encounter (Signed)
Good afternoon Carlos Nearing  PA-C   Just heard from him that he does not want to have a colonoscopy.  I have clearly explained to him at his office visit that the purpose of the colonoscopy is to rule out any underlying lesion such as an early cancer that could have precipitated the diverticulitis.  I still believe that he should get 1.  Please see if he would change his mind when he comes to see you at his follow-up visits and I would be very happy to get it arranged at the earliest.RegardsDr [name] MD,MRCP [facility]) Gastroenterology/Hepatology Pager: [phone] '

## 2019-12-22 NOTE — Telephone Encounter (Signed)
-----   Message from [name], MD sent at 12/19/2019 12:31 PM EDT ----- Denton Meek   Inform with the diverticulitis has resolved.  Please proceed with colonoscopy.  C/c Particia Nearing, PA-C to follow-up on abdominal aortic aneurysm which requires a follow-up imaging as recommended in 2 years  Dr [name] MD,MRCP [facility]) Gastroenterology/Hepatology Pager: [phone]

## 2019-12-23 NOTE — Telephone Encounter (Signed)
Will address at next visit, have left myself a reminder to do so. Thanks!

## 2020-04-26 DIAGNOSIS — H2513 Age-related nuclear cataract, bilateral: Secondary | ICD-10-CM | POA: Diagnosis not present

## 2020-11-20 DIAGNOSIS — I700 Atherosclerosis of aorta: Secondary | ICD-10-CM | POA: Insufficient documentation

## 2021-03-12 DIAGNOSIS — Z23 Encounter for immunization: Secondary | ICD-10-CM | POA: Diagnosis not present

## 2021-03-12 DIAGNOSIS — E162 Hypoglycemia, unspecified: Secondary | ICD-10-CM | POA: Diagnosis not present

## 2021-03-12 DIAGNOSIS — E669 Obesity, unspecified: Secondary | ICD-10-CM | POA: Diagnosis not present

## 2021-03-12 DIAGNOSIS — R7303 Prediabetes: Secondary | ICD-10-CM | POA: Diagnosis not present

## 2021-03-12 DIAGNOSIS — K5790 Diverticulosis of intestine, part unspecified, without perforation or abscess without bleeding: Secondary | ICD-10-CM | POA: Diagnosis not present

## 2021-03-12 DIAGNOSIS — Z1159 Encounter for screening for other viral diseases: Secondary | ICD-10-CM | POA: Diagnosis not present

## 2021-03-12 DIAGNOSIS — R55 Syncope and collapse: Secondary | ICD-10-CM | POA: Diagnosis not present

## 2021-05-15 IMAGING — CT CT ABD-PELV W/ CM
2 of 5 series · 15 of 46 positions shown, 17 images · IV contrast (APPLIED)
Comparison: None.

CLINICAL DATA: Diverticulitis suspected, abdominal pain and rectal
bleeding

EXAM:
CT ABDOMEN AND PELVIS WITH CONTRAST
TECHNIQUE: Multidetector CT imaging of the abdomen and pelvis was performed
using the standard protocol following bolus administration of
intravenous contrast.
CONTRAST:  125mL OMNIPAQUE IOHEXOL 300 MG/ML  SOLN

[Series 2: routine abd/pel with · axial · 0.92mm/px · z∈[-852,-382]mm · 12 of 106 slices shown, 14 images]
[im 6/106  soft-tissue]
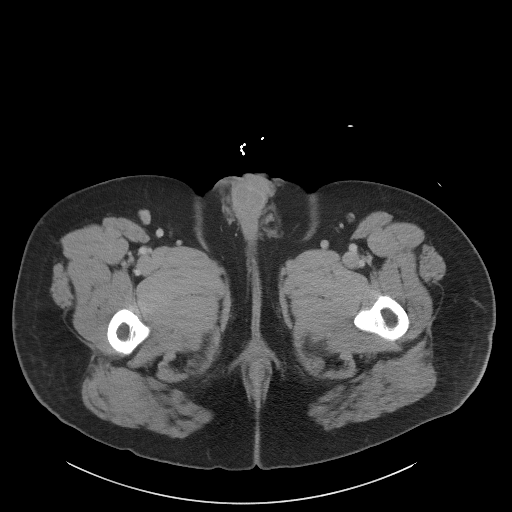
[im 6/106  bone]
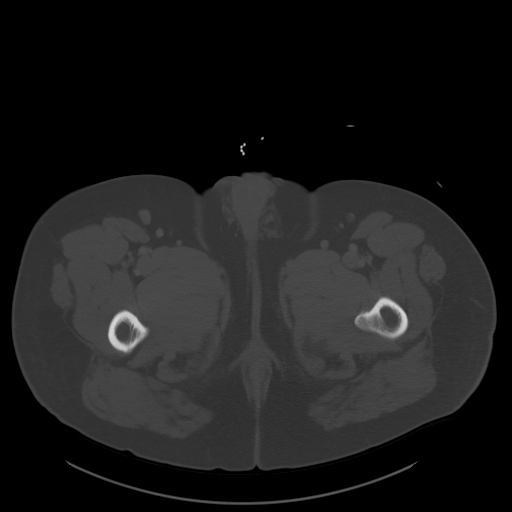
[im 17/106  soft-tissue]
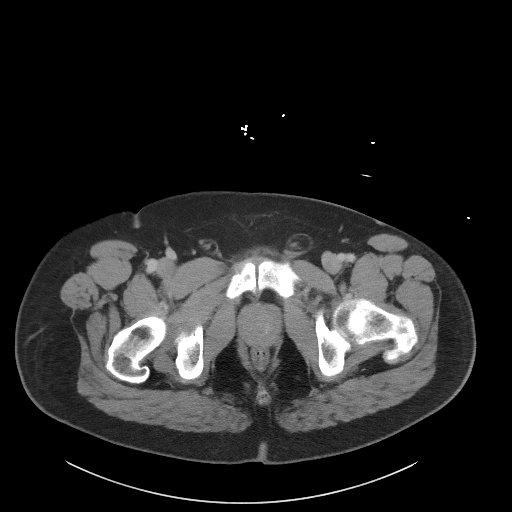
[im 23/106  soft-tissue]
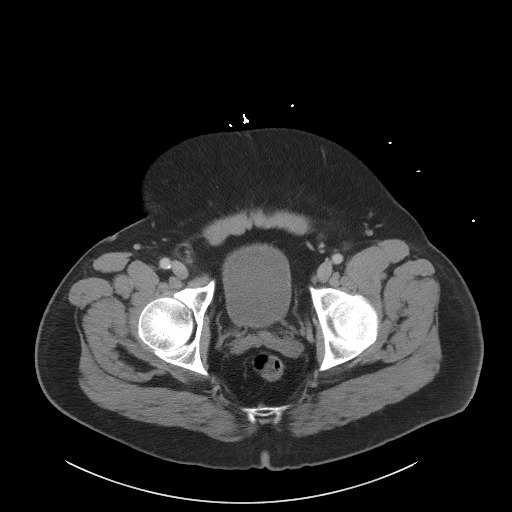
[im 34/106  soft-tissue]
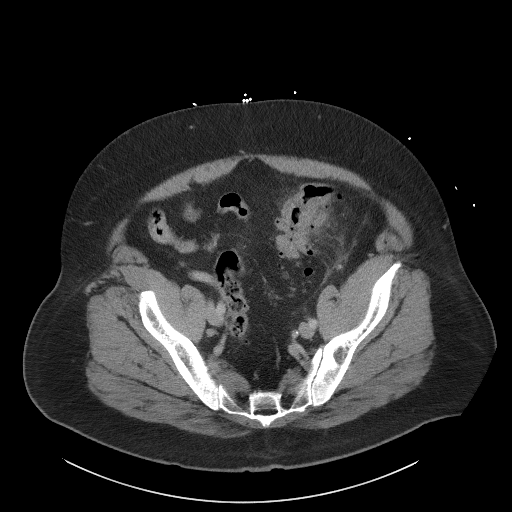
[im 39/106  soft-tissue]
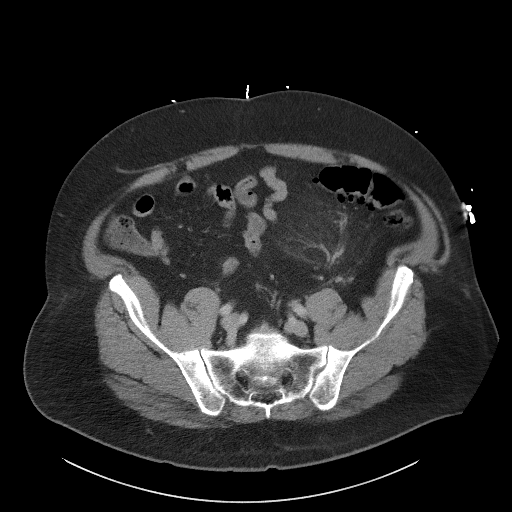
[im 50/106  soft-tissue]
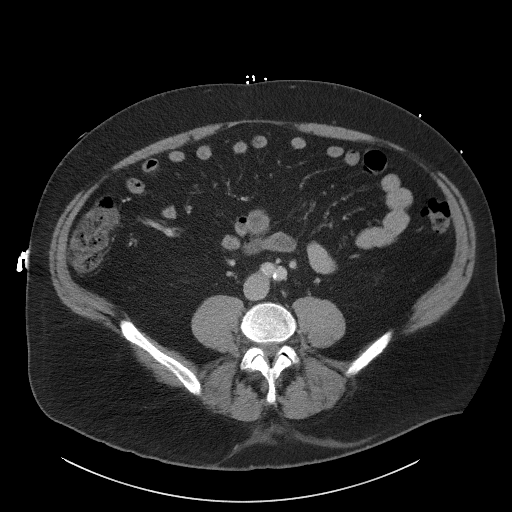
[im 56/106  soft-tissue]
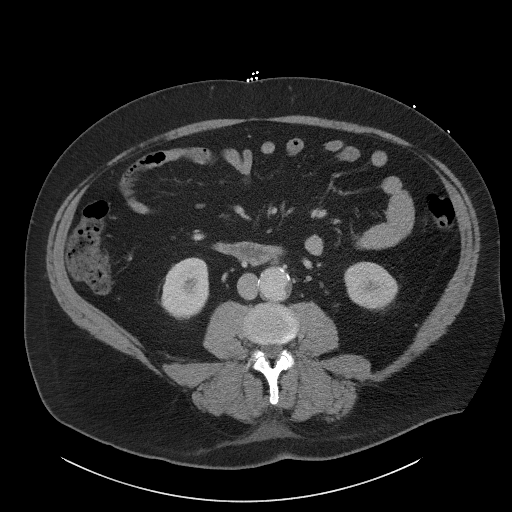
[im 67/106  soft-tissue]
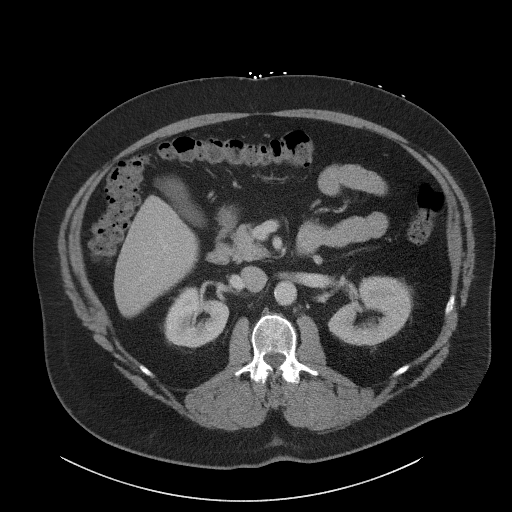
[im 72/106  soft-tissue]
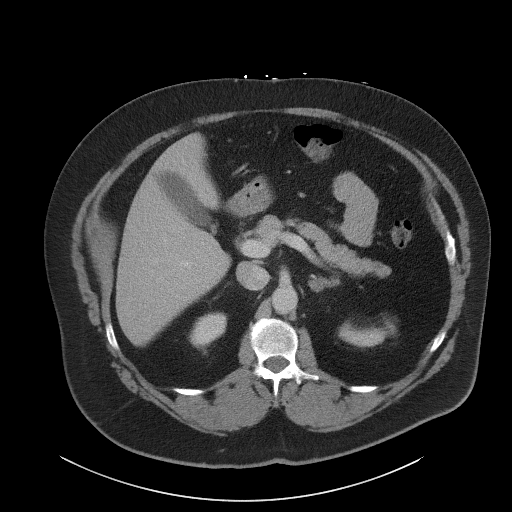
[im 72/106  bone]
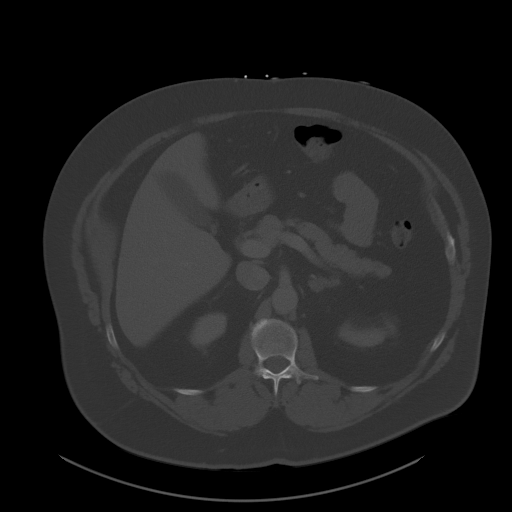
[im 83/106  soft-tissue]
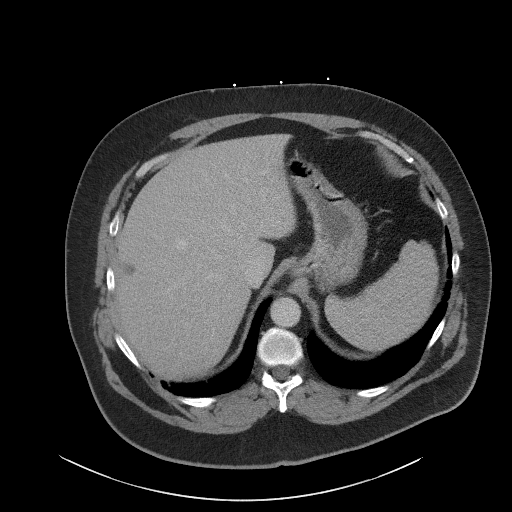
[im 89/106  soft-tissue]
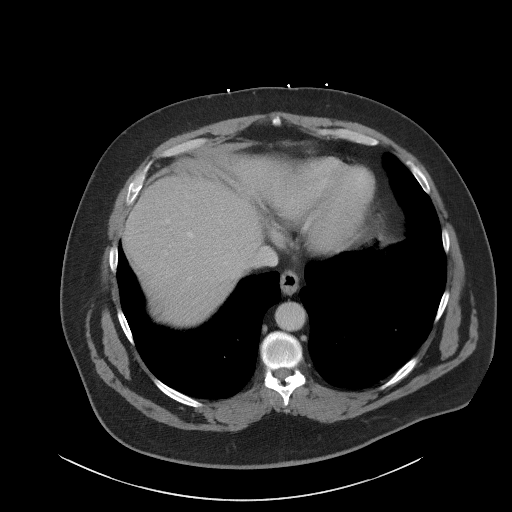
[im 100/106  soft-tissue]
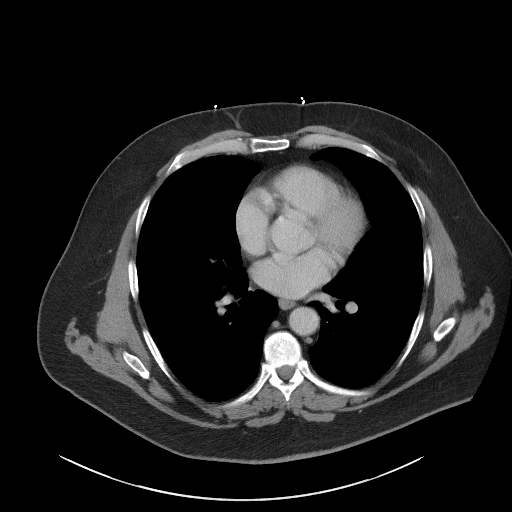

[Series 5: coronal st · coronal · 0.89mm/px · 3 of 119 slices shown]
[im 40/119  soft-tissue]
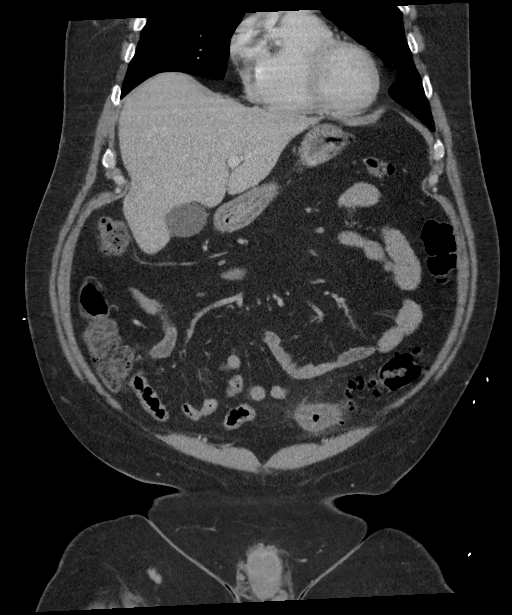
[im 53/119  soft-tissue]
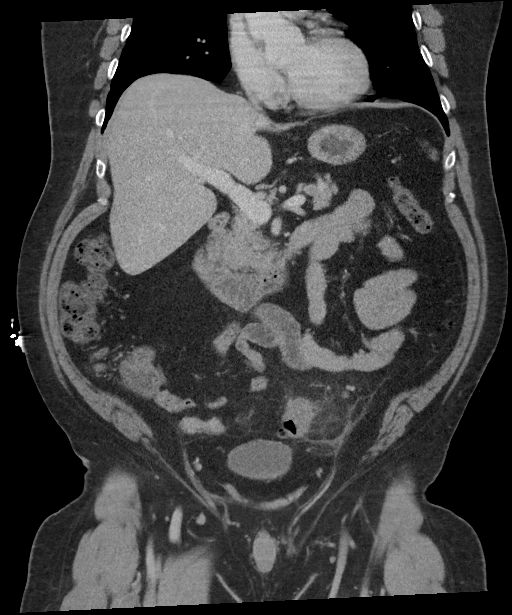
[im 66/119  soft-tissue]
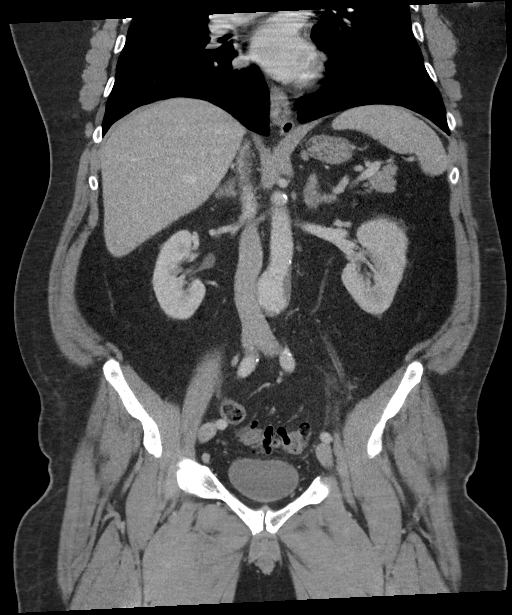

[15 of 46 positions shown; findings below may reference images not displayed]

FINDINGS: Lower chest: Bandlike atelectasis in the lingula. Lung bases are
otherwise clear. Normal heart size. No pericardial effusion. Few
coronary artery calcifications are present.

Hepatobiliary: Small subcapsular 1.5 cm fluid attenuation cyst in
the right lobe liver. No focal liver abnormality is seen. No
gallstones, gallbladder wall thickening, or biliary dilatation.

Pancreas: Unremarkable. No pancreatic ductal dilatation or
surrounding inflammatory changes.

Spleen: Normal in size without focal abnormality.

Adrenals/Urinary Tract: Mild thickening of the left adrenal gland
without discrete nodularity. Mild bilateral symmetric perinephric
stranding, a nonspecific finding though may correlate with either
age or decreased renal function. Kidneys are otherwise unremarkable,
without renal calculi, suspicious lesion, or hydronephrosis. Bladder
is unremarkable.

Stomach/Bowel: Distal esophagus, stomach and duodenal sweep are
unremarkable. No small bowel wall thickening or dilatation. No
evidence of obstruction. A normal appendix is visualized. Focally
thickened segment of the sigmoid colon with adjacent phlegmonous
change centered upon a culprit diverticulum in the left lower
quadrant (5/46). No extraluminal gas, organized abscess or
collection is seen.

Vascular/Lymphatic: Atherosclerotic plaque within the normal caliber
aorta. Focal broad-based saccular aneurysmal outpouching of the
infrarenal abdominal aorta measuring up to 3.1 cm in maximal
diameter with calcified and noncalcified mural plaque. No extension
into the iliac arteries. No suspicious or pathologically enlarged
lymph nodes in the included lymphatic chains. Reactive adenopathy is
present in the left lower quadrant.

Reproductive: The prostate and seminal vesicles are unremarkable.

Other: Phlegmonous change in the left lower quadrant, as detailed
above. No free fluid. No free air. Small fat containing umbilical
hernia with a 1.1 by 1.2 cm hernia neck. No bowel containing
hernias.

Musculoskeletal: No acute osseous abnormality or suspicious osseous
lesion.
IMPRESSION: 1. Acute, uncomplicated sigmoid diverticulitis with adjacent
phlegmon.
2. Focal broad-based saccular aneurysmal outpouching of the
infrarenal abdominal aorta measuring up to 3.1 cm in maximal
diameter. Recommend follow-up by ultrasound in 1 year. This
recommendation follows ACR consensus guidelines: White Paper of the
ACR Incidental Findings Committee II on Vascular Findings. J Am Coll
3. Aortic Atherosclerosis (NGG4E-Z1T.T).

## 2021-07-16 DIAGNOSIS — Z0000 Encounter for general adult medical examination without abnormal findings: Secondary | ICD-10-CM | POA: Diagnosis not present

## 2021-07-16 DIAGNOSIS — R7303 Prediabetes: Secondary | ICD-10-CM | POA: Diagnosis not present

## 2021-07-16 DIAGNOSIS — R55 Syncope and collapse: Secondary | ICD-10-CM | POA: Diagnosis not present

## 2021-07-16 DIAGNOSIS — I714 Abdominal aortic aneurysm, without rupture, unspecified: Secondary | ICD-10-CM | POA: Diagnosis not present

## 2021-07-16 DIAGNOSIS — E669 Obesity, unspecified: Secondary | ICD-10-CM | POA: Diagnosis not present

## 2021-07-17 IMAGING — CT CT ABD-PELV W/ CM
2 of 5 series · 16 of 46 positions shown, 18 images · IV contrast (omnipaque)
Comparison: None.

CLINICAL DATA: Left lower quadrant pain

EXAM:
CT ABDOMEN AND PELVIS WITH CONTRAST
TECHNIQUE: Multidetector CT imaging of the abdomen and pelvis was performed
using the standard protocol following bolus administration of
intravenous contrast.
CONTRAST:  100mL OMNIPAQUE IOHEXOL 300 MG/ML  SOLN

[Series 2: abd pelvis 5.00 · axial · 0.81mm/px · z∈[-1552,-1117]mm · 13 of 101 slices shown, 15 images]
[im 7/101  soft-tissue]
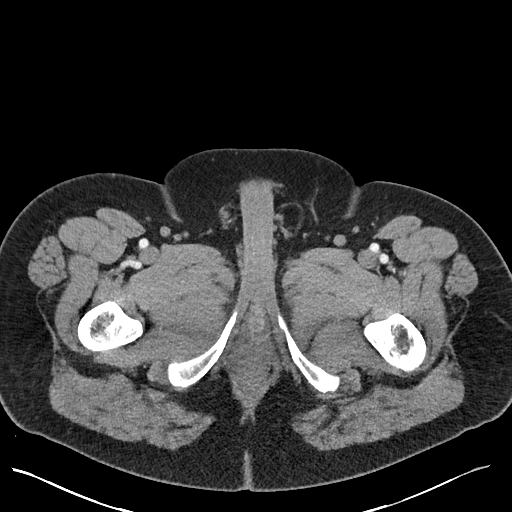
[im 7/101  bone]
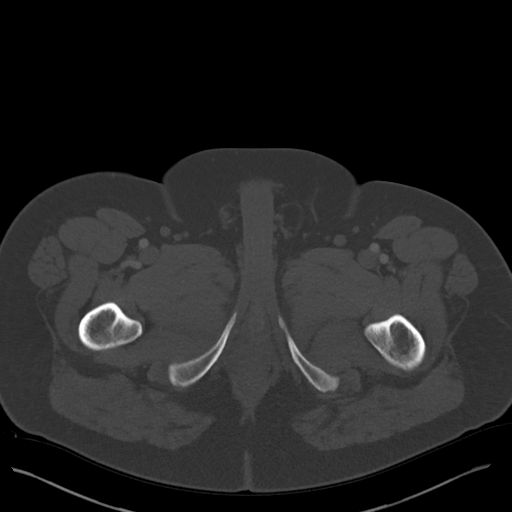
[im 13/101  soft-tissue]
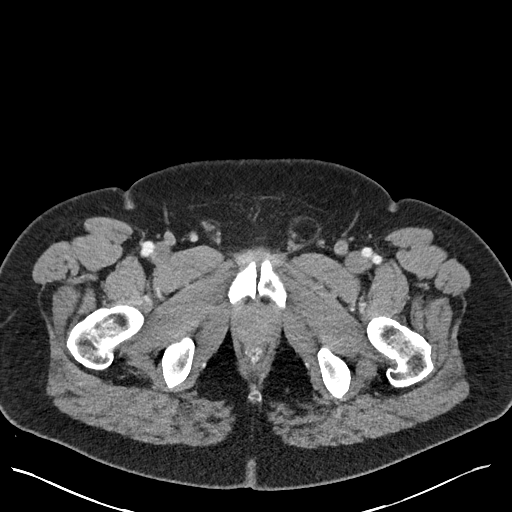
[im 19/101  soft-tissue]
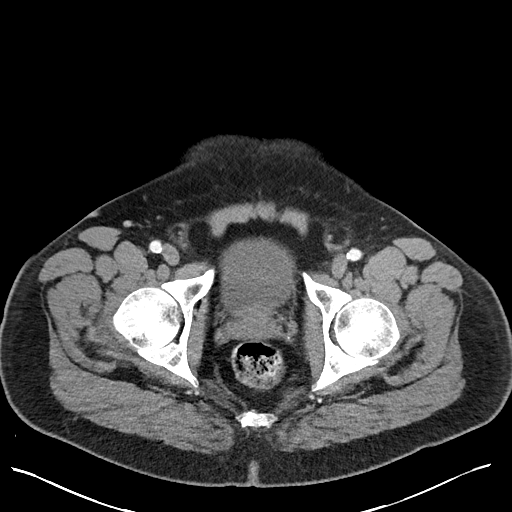
[im 32/101  soft-tissue]
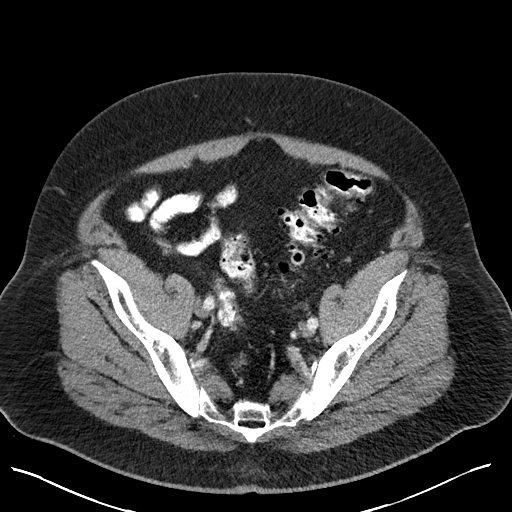
[im 38/101  soft-tissue]
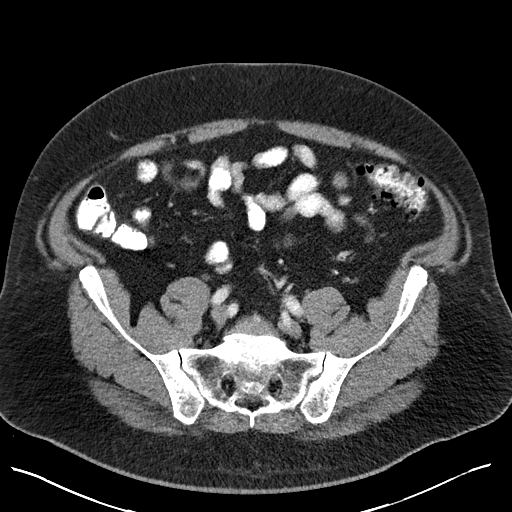
[im 44/101  soft-tissue]
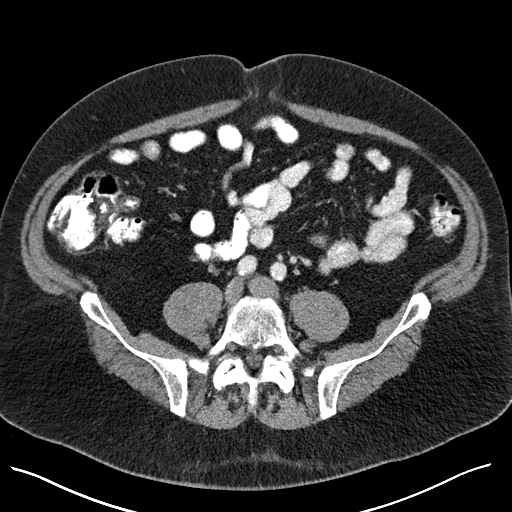
[im 51/101  soft-tissue]
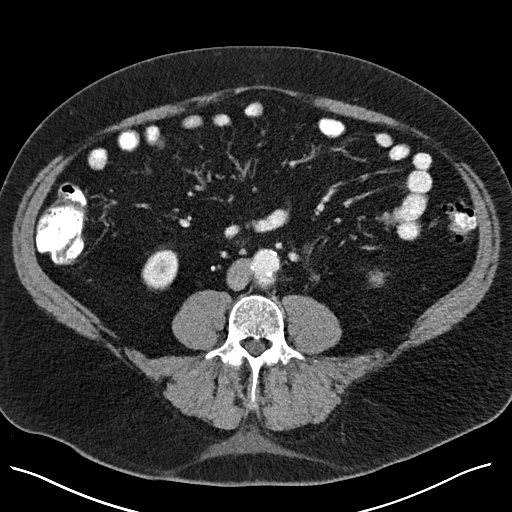
[im 57/101  soft-tissue]
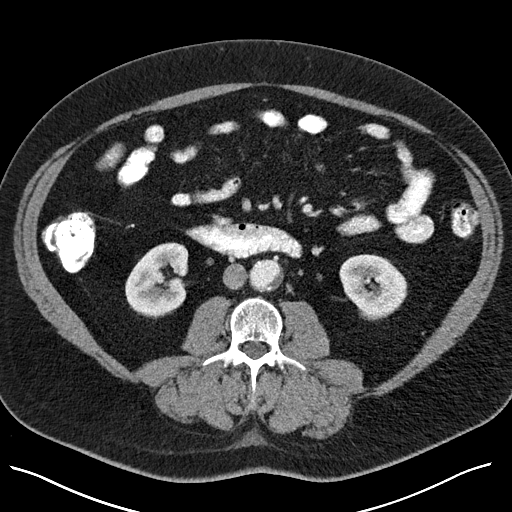
[im 63/101  soft-tissue]
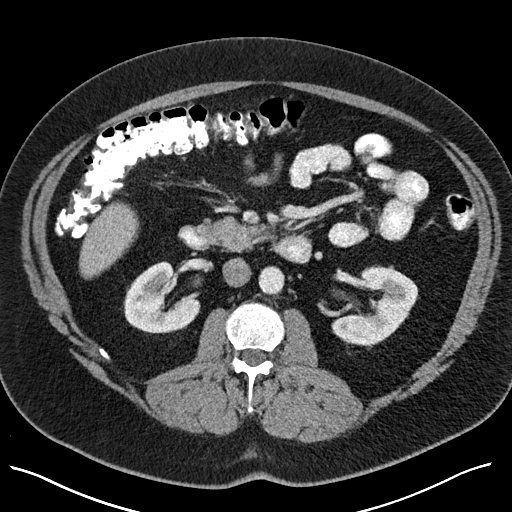
[im 63/101  bone]
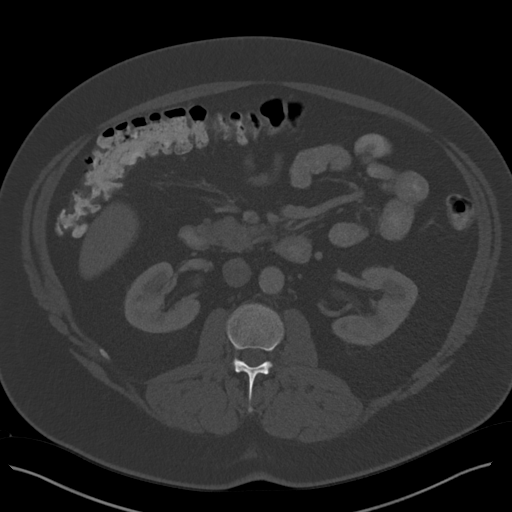
[im 69/101  soft-tissue]
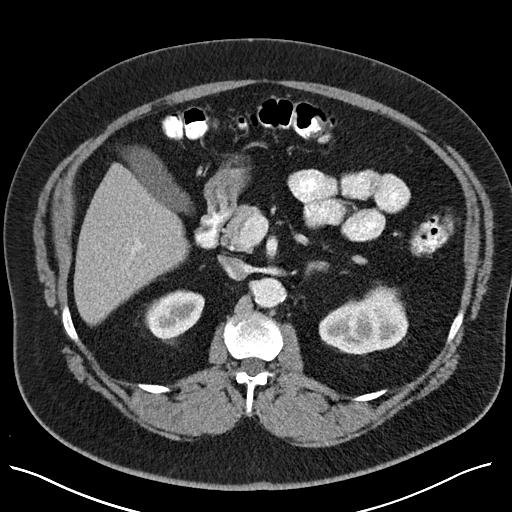
[im 82/101  soft-tissue]
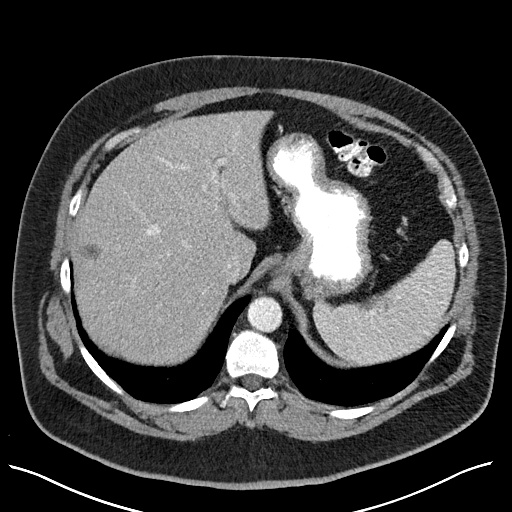
[im 88/101  soft-tissue]
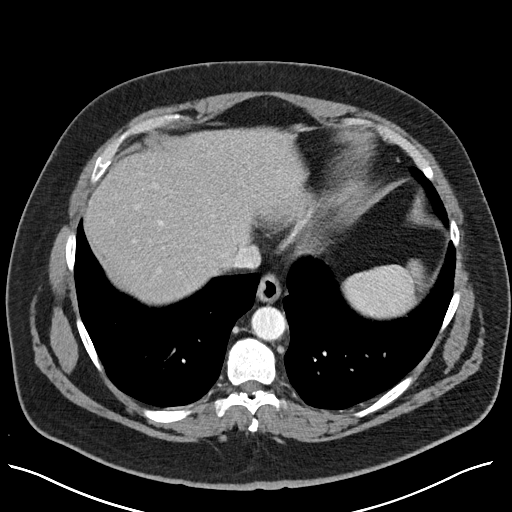
[im 94/101  soft-tissue]
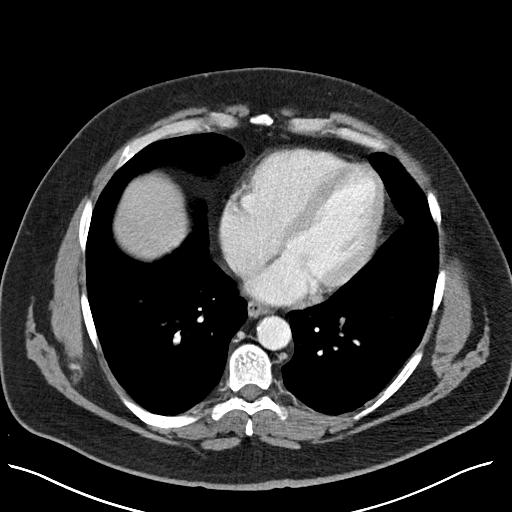

[Series 4: coronals abd pelvis 2.00 cor · coronal · 0.81mm/px · 3 of 185 slices shown]
[im 62/185  soft-tissue]
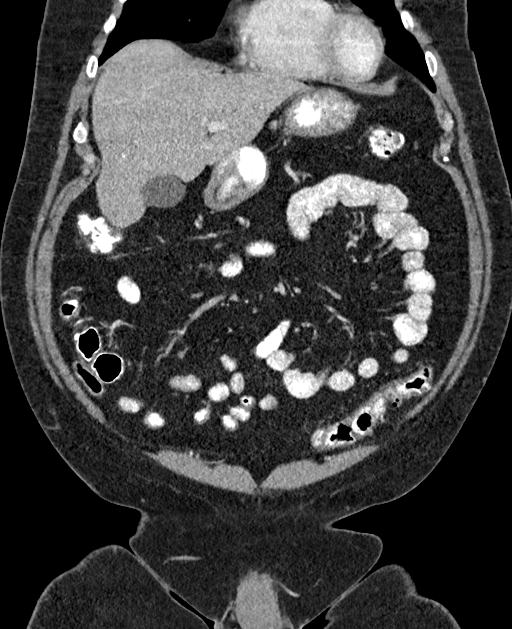
[im 82/185  soft-tissue]
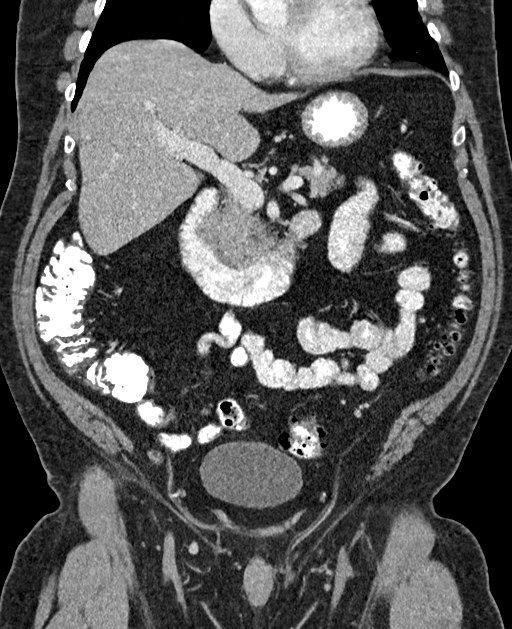
[im 103/185  soft-tissue]
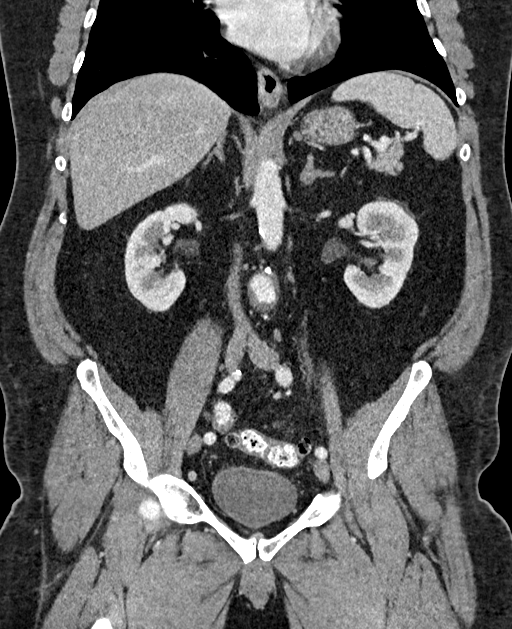

[16 of 46 positions shown; findings below may reference images not displayed]

FINDINGS: Lower chest: No acute abnormality. Mild lingular scarring.

Hepatobiliary: Diffuse low attenuation as can be seen with hepatic
steatosis. 14 mm hypodense, fluid attenuating right hepatic mass
consistent with a cyst. No other hepatic mass. Gallbladder is
normal. No biliary ductal dilatation.

Pancreas: Unremarkable. No pancreatic ductal dilatation or
surrounding inflammatory changes.

Spleen: Normal in size without focal abnormality.

Adrenals/Urinary Tract: Adrenal glands are unremarkable. Kidneys are
normal, without renal calculi, focal lesion, or hydronephrosis.
Bladder is unremarkable.

Stomach/Bowel: Stomach is within normal limits. Appendix appears
normal. No evidence of bowel wall thickening, distention, or
inflammatory changes. Diverticulosis without evidence of
diverticulitis.

Vascular/Lymphatic: Abdominal aortic aneurysm measuring 3.6 cm.
Abdominal aortic atherosclerosis. No lymphadenopathy.

Reproductive: Prostate is unremarkable.

Other: Fat containing umbilical hernia.  No abdominopelvic ascites.

Musculoskeletal: No acute osseous abnormality. No aggressive osseous
lesion.
IMPRESSION: 1. Diverticulosis without evidence of diverticulitis.
2. Hepatic steatosis.
3. Abdominal aortic aneurysm measuring 3.6 cm. Abdominal Aortic
Aneurysm (A2GWM-EP4.A). Recommend follow-up aortic ultrasound in 2
years. This recommendation follows ACR consensus guidelines: White
Paper of the ACR Incidental Findings Committee II on Vascular
Findings. [HOSPITAL] 1660; [DATE].
4. Aortic Atherosclerosis (A2GWM-RMS.S).

## 2021-07-23 DIAGNOSIS — E669 Obesity, unspecified: Secondary | ICD-10-CM | POA: Diagnosis not present

## 2021-07-30 DIAGNOSIS — E669 Obesity, unspecified: Secondary | ICD-10-CM | POA: Diagnosis not present

## 2021-07-30 DIAGNOSIS — I714 Abdominal aortic aneurysm, without rupture, unspecified: Secondary | ICD-10-CM | POA: Diagnosis not present

## 2021-07-30 DIAGNOSIS — Z6841 Body Mass Index (BMI) 40.0 and over, adult: Secondary | ICD-10-CM | POA: Diagnosis not present

## 2021-07-30 DIAGNOSIS — Z9189 Other specified personal risk factors, not elsewhere classified: Secondary | ICD-10-CM | POA: Diagnosis not present

## 2023-02-02 ENCOUNTER — Encounter: Payer: Self-pay | Admitting: *Deleted

## 2023-02-02 ENCOUNTER — Other Ambulatory Visit: Payer: Self-pay

## 2023-02-02 ENCOUNTER — Emergency Department
Admission: EM | Admit: 2023-02-02 | Discharge: 2023-02-02 | Disposition: A | Discharge status: Home / Self Care | Payer: BC Managed Care – PPO | Attending: Emergency Medicine | Admitting: Emergency Medicine

## 2023-02-02 ENCOUNTER — Emergency Department: Payer: BC Managed Care – PPO

## 2023-02-02 DIAGNOSIS — H1131 Conjunctival hemorrhage, right eye: Secondary | ICD-10-CM | POA: Diagnosis not present

## 2023-02-02 DIAGNOSIS — G43919 Migraine, unspecified, intractable, without status migrainosus: Secondary | ICD-10-CM | POA: Diagnosis not present

## 2023-02-02 DIAGNOSIS — R519 Headache, unspecified: Secondary | ICD-10-CM | POA: Diagnosis present

## 2023-02-02 MED ORDER — KETOROLAC TROMETHAMINE 30 MG/ML IJ SOLN
30.0000 mg | Freq: Once | INTRAMUSCULAR | Status: AC
  Administered 2023-02-02: 30 mg via INTRAMUSCULAR
  Filled 2023-02-02: qty 1

## 2023-02-02 MED ORDER — DIPHENHYDRAMINE HCL 25 MG PO CAPS
50.0000 mg | ORAL_CAPSULE | Freq: Once | ORAL | Status: AC
  Administered 2023-02-02: 50 mg via ORAL
  Filled 2023-02-02: qty 2

## 2023-02-02 MED ORDER — PROMETHAZINE HCL 25 MG/ML IJ SOLN
25.0000 mg | Freq: Once | INTRAMUSCULAR | Status: AC
  Administered 2023-02-02: 25 mg via INTRAMUSCULAR
  Filled 2023-02-02: qty 1

## 2023-02-02 MED ORDER — SUMATRIPTAN SUCCINATE 6 MG/0.5ML ~~LOC~~ SOLN
6.0000 mg | Freq: Once | SUBCUTANEOUS | Status: AC
  Administered 2023-02-02: 6 mg via SUBCUTANEOUS
  Filled 2023-02-02: qty 0.5

## 2023-02-02 MED ORDER — TETRACAINE HCL 0.5 % OP SOLN
2.0000 [drp] | Freq: Once | OPHTHALMIC | Status: AC
  Administered 2023-02-02: 2 [drp] via OPHTHALMIC
  Filled 2023-02-02: qty 4

## 2023-02-02 NOTE — ED Provider Notes (Signed)
[facility] Provider Note  Patient Contact: 6:44 PM (approximate)   History   Headache   HPI  Carlos Holmes is a 62 y.o. male who presents the emergency department complaining of headache.  Patient has a regular weekly headache that typically responds to over-the-counter medications.  Patient has had no trauma, no atypical features.  He stated that he had a headache today but one of his coworkers saw that he had some subconjunctival hemorrhage to the right eye.  This was atypical he presents for evaluation.  Patient denies any unilateral weakness, slurred speech, wife is present and states that he is at his baseline.  No trauma to the eye.  Does wear glasses, does not wear contacts.  No foreign body sensation.  No blurred vision.     Physical Exam   Triage Vital Signs: ED Triage Vitals [02/02/23 1658]  Enc Vitals Group     BP (!) 151/96     Pulse Rate 91     Resp 20     Temp 97.8 F (36.6 C)     Temp Source Oral     SpO2 97 %     Weight 270 lb (122.5 kg)     Height 5\' 11"  (1.803 m)     Head Circumference      Peak Flow      Pain Score 6     Pain Loc      Pain Edu?      Excl. in GC?     Most recent vital signs: Vitals:   02/02/23 1658  BP: (!) 151/96  Pulse: 91  Resp: 20  Temp: 97.8 F (36.6 C)  SpO2: 97%     General: Alert and in no acute distress. Eyes:  PERRL. EOMI. 5 readings with Tono-Pen are obtained under local anesthetic drops.  Patient has a measured pressure of 7 via Tono-Pen. Head: No acute traumatic findings  Neck: No stridor. No cervical spine tenderness to palpation  Cardiovascular:  Good peripheral perfusion Respiratory: Normal respiratory effort without tachypnea or retractions. Lungs CTAB. Musculoskeletal: Full range of motion to all extremities.  Neurologic:  No gross focal neurologic deficits are appreciated.  Cranial nerves II through XII grossly intact. Skin:   No rash noted Other:   ED Results / Procedures /  Treatments   Labs (all labs ordered are listed, but only abnormal results are displayed) Labs Reviewed - No data to display   EKG     RADIOLOGY  I personally viewed, evaluated, and interpreted these images as part of my medical decision making, as well as reviewing the written report by the radiologist.  ED Provider Interpretation: No acute findings on CT scan  CT Head Wo Contrast  Result Date: 02/02/2023 CLINICAL DATA:  Headache EXAM: CT HEAD WITHOUT CONTRAST TECHNIQUE: Contiguous axial images were obtained from the base of the skull through the vertex without intravenous contrast. RADIATION DOSE REDUCTION: This exam was performed according to the departmental dose-optimization program which includes automated exposure control, adjustment of the mA and/or kV according to patient size and/or use of iterative reconstruction technique. COMPARISON:  Head CT 12/09/2016 FINDINGS: Brain: No evidence of acute infarction, hemorrhage, hydrocephalus, extra-axial collection or mass lesion/mass effect. Vascular: No hyperdense vessel or unexpected calcification. Skull: Normal. Negative for fracture or focal lesion. Sinuses/Orbits: No acute finding. Other: None. IMPRESSION: No acute intracranial abnormality. Electronically Signed   By: Darliss Cheney M.D.   On: 02/02/2023 17:52    PROCEDURES:  Critical Care  performed: No  Procedures   MEDICATIONS ORDERED IN ED: Medications  tetracaine (PONTOCAINE) 0.5 % ophthalmic solution 2 drop (has no administration in time range)  ketorolac (TORADOL) 30 MG/ML injection 30 mg (has no administration in time range)  promethazine (PHENERGAN) injection 25 mg (has no administration in time range)  SUMAtriptan (IMITREX) injection 6 mg (has no administration in time range)  diphenhydrAMINE (BENADRYL) capsule 50 mg (has no administration in time range)     IMPRESSION / MDM / ASSESSMENT AND PLAN / ED COURSE  I reviewed the triage vital signs and the nursing  notes.                                 Differential diagnosis includes, but is not limited to, migraine, tension type headache, cluster headache, intracranial hemorrhage, cerebral mass, glaucoma, ocular trauma, subconjunctival hemorrhage   Patient's presentation is most consistent with acute presentation with potential threat to life or bodily function.   Patient's diagnosis is consistent with migraine, subconjunctival hemorrhage.  Patient presents emergency department with a typical migraine.  He has sees weekly.  He is does not take any medicine as a preventative or abortive medication other than over-the-counter Tylenol and Motrin.  Patient presents today as he had some subconjunctival hemorrhage to the right eye.  No visual changes.  He does wear glasses but no contacts.  Patient was neurologically intact on exam.  Did appear that patient likely just had subconjunctival hemorrhage in conjunction with his headache but CT scan was obtained and Tono-Pen readings to the right eye.  CT scan is reassuring.  As there is no atypical headache features I do not feel that further imaging with MRI or angio of the head are deemed warranted at this time.  Patient had Tono-Pen readings to the right eye which revealed a pressure of 7 mmHg.  At this time patient has no other concerning symptoms.  Will be treated for migraine headache with medications and emergency department.  He has never seen neurology for his recurrent migraines and I do recommend that he follow-up with same.  Concerning signs and symptoms and return precautions discussed with the patient at this time.. Patient is given ED precautions to return to the ED for any worsening or new symptoms.     FINAL CLINICAL IMPRESSION(S) / ED DIAGNOSES   Final diagnoses:  Intractable migraine without status migrainosus, unspecified migraine type  Subconjunctival hemorrhage of right eye     Rx / DC Orders   ED Discharge Orders     None         Note:  This document was prepared using Dragon voice recognition software and may include unintentional dictation errors.   Racheal Patches, PA-C 02/02/23 1943    Corena Herter, MD 02/02/23 [phone]

## 2023-02-02 NOTE — ED Triage Notes (Addendum)
Pt reports a headache and redness of right eye.  Sx began at 5am with a headache.  No n/v  pt took asa today without relief.  No slurred speech  no weakness.  Pt ambulates without diff.    Pt alert  speech clear.
